# Patient Record
Sex: Female | Born: 1985 | Marital: Married | Smoking: Never smoker
Health system: Southern US, Community
[De-identification: ages and names within clinical notes are randomized; demographics above are authoritative.]

## PROBLEM LIST (undated history)

## (undated) DIAGNOSIS — N96 Recurrent pregnancy loss: Secondary | ICD-10-CM

## (undated) DIAGNOSIS — E669 Obesity, unspecified: Secondary | ICD-10-CM

## (undated) HISTORY — PX: INTRAUTERINE DEVICE (IUD) INSERTION: SHX5877

## (undated) HISTORY — PX: APPENDECTOMY: SHX54

## (undated) HISTORY — DX: Obesity, unspecified: E66.9

## (undated) HISTORY — DX: Recurrent pregnancy loss: N96

---

## 2019-01-26 ENCOUNTER — Telehealth: Payer: Self-pay | Admitting: Family

## 2019-01-26 DIAGNOSIS — R05 Cough: Secondary | ICD-10-CM

## 2019-01-26 DIAGNOSIS — R059 Cough, unspecified: Secondary | ICD-10-CM

## 2019-01-26 MED ORDER — BENZONATATE 200 MG PO CAPS: 200.0000 mg | ORAL_CAPSULE | Freq: Three times a day (TID) | ORAL | 0 refills | Status: DC | PRN

## 2019-01-26 MED ORDER — ALBUTEROL SULFATE 108 (90 BASE) MCG/ACT IN AEPB: 2.0000 | INHALATION_SPRAY | RESPIRATORY_TRACT | 2 refills | Status: DC | PRN

## 2019-01-26 NOTE — Progress Notes (Signed)
Greater than 5 minutes, yet less than 10 minutes of time have been spent researching, coordinating, and implementing care for this patient today.  Thank you for the details you included in the comment boxes. Those details are very helpful in determining the best course of treatment for you and help us to provide the best care.  E-Visit for Corona Virus Screening  Based on your current symptoms, it seems unlikely that your symptoms are related to the Coronavirus.   Coronavirus disease 2019 (COVID-19) is a respiratory illness that can spread from person to person. The virus that causes COVID-19 is a new virus that was first identified in the country of [place] but is now found in multiple other countries and has spread to the [place].  Symptoms associated with the virus are mild to severe fever, cough, and shortness of breath. There is currently no vaccine to protect against COVID-19, and there is no specific antiviral treatment for the virus.   To be considered HIGH RISK for Coronavirus (COVID-19), you have to meet the following criteria:  . Traveled to [place], [place], [place], [place] or [place]; or in the [place] to [place], [place], [place], or [place]; and have fever, cough, and shortness of breath within the last 2 weeks of travel OR  . Been in close contact with a person diagnosed with COVID-19 within the last 2 weeks and have fever, cough, and shortness of breath  . IF YOU DO NOT MEET THESE CRITERIA, YOU ARE CONSIDERED LOW RISK FOR COVID-19.   It is vitally important that if you feel that you have an infection such as this virus or any other virus that you stay home and away from places where you may spread it to others.  You should self-quarantine for 14 days if you have symptoms that could potentially be coronavirus and avoid contact with people age 65 and older.   You can use medication such as A prescription cough medication called Tessalon Perles 100 mg. You may take 1-2  capsules every 8 hours as needed for cough and A prescription inhaler called Albuterol MDI 90 mcg /actuation 2 puffs every 4 hours as needed for shortness of breath, wheezing, cough  You may also take acetaminophen (Tylenol) as needed for fever.   Reduce your risk of any infection by using the same precautions used for avoiding the common cold or flu:  . Wash your hands often with soap and warm water for at least 20 seconds.  If soap and water are not readily available, use an alcohol-based hand sanitizer with at least 60% alcohol.  . If coughing or sneezing, cover your mouth and nose by coughing or sneezing into the elbow areas of your shirt or coat, into a tissue or into your sleeve (not your hands). . Avoid shaking hands with others and consider head nods or verbal greetings only. . Avoid touching your eyes, nose, or mouth with unwashed hands.  . Avoid close contact with people who are sick. . Avoid places or events with large numbers of people in one location, like concerts or sporting events. . Carefully consider travel plans you have or are making. . If you are planning any travel outside or inside the [place], visit the CDC's Travelers' Health webpage for the latest health notices. . If you have some symptoms but not all symptoms, continue to monitor at home and seek medical attention if your symptoms worsen. . If you are having a medical emergency, call 911.    HOME CARE . Only take medications as instructed by your medical team. . Drink plenty of fluids and get plenty of rest. . A steam or ultrasonic humidifier can help if you have congestion.   GET HELP RIGHT AWAY IF: . You develop worsening fever. . You become short of breath . You cough up blood. . Your symptoms become more severe MAKE SURE YOU   Understand these instructions.  Will watch your condition.  Will get help right away if you are not doing well or get worse.  Your e-visit answers were reviewed by a board certified  advanced clinical practitioner to complete your personal care plan.  Depending on the condition, your plan could have included both over the counter or prescription medications.  If there is a problem please reply once you have received a response from your provider. Your safety is important to us.  If you have drug allergies check your prescription carefully.    You can use MyChart to ask questions about today's visit, request a non-urgent call back, or ask for a work or school excuse for 24 hours related to this e-Visit. If it has been greater than 24 hours you will need to follow up with your provider, or enter a new e-Visit to address those concerns. You will get an e-mail in the next two days asking about your experience.  I hope that your e-visit has been valuable and will speed your recovery. Thank you for using e-visits.    

## 2019-03-08 ENCOUNTER — Ambulatory Visit (INDEPENDENT_AMBULATORY_CARE_PROVIDER_SITE_OTHER): Payer: No Typology Code available for payment source | Admitting: Physician Assistant

## 2019-03-08 ENCOUNTER — Other Ambulatory Visit: Payer: Self-pay

## 2019-03-08 ENCOUNTER — Encounter: Payer: Self-pay | Admitting: Physician Assistant

## 2019-03-08 VITALS — BP 132/87 | HR 95 | Temp 98.8°F | Ht 66.0 in | Wt 188.0 lb

## 2019-03-08 DIAGNOSIS — Z0184 Encounter for antibody response examination: Secondary | ICD-10-CM

## 2019-03-08 DIAGNOSIS — R030 Elevated blood-pressure reading, without diagnosis of hypertension: Secondary | ICD-10-CM

## 2019-03-08 DIAGNOSIS — E6609 Other obesity due to excess calories: Secondary | ICD-10-CM

## 2019-03-08 DIAGNOSIS — Z7689 Persons encountering health services in other specified circumstances: Secondary | ICD-10-CM

## 2019-03-08 DIAGNOSIS — Z6830 Body mass index (BMI) 30.0-30.9, adult: Secondary | ICD-10-CM

## 2019-03-08 DIAGNOSIS — Z0289 Encounter for other administrative examinations: Secondary | ICD-10-CM | POA: Diagnosis not present

## 2019-03-08 DIAGNOSIS — Z111 Encounter for screening for respiratory tuberculosis: Secondary | ICD-10-CM

## 2019-03-08 DIAGNOSIS — Z130 Encounter for screening for diseases of the blood and blood-forming organs and certain disorders involving the immune mechanism: Secondary | ICD-10-CM

## 2019-03-08 DIAGNOSIS — Z1329 Encounter for screening for other suspected endocrine disorder: Secondary | ICD-10-CM

## 2019-03-08 DIAGNOSIS — N96 Recurrent pregnancy loss: Secondary | ICD-10-CM | POA: Insufficient documentation

## 2019-03-08 DIAGNOSIS — Z975 Presence of (intrauterine) contraceptive device: Secondary | ICD-10-CM

## 2019-03-08 NOTE — Patient Instructions (Signed)
Preventive Care 18-39 Years, Female Preventive care refers to lifestyle choices and visits with your health care provider that can promote health and wellness. What does preventive care include?   A yearly physical exam. This is also called an annual well check.  Dental exams once or twice a year.  Routine eye exams. Ask your health care provider how often you should have your eyes checked.  Personal lifestyle choices, including: ? Daily care of your teeth and gums. ? Regular physical activity. ? Eating a healthy diet. ? Avoiding tobacco and drug use. ? Limiting alcohol use. ? Practicing safe sex. ? Taking vitamin and mineral supplements as recommended by your health care provider. What happens during an annual well check? The services and screenings done by your health care provider during your annual well check will depend on your age, overall health, lifestyle risk factors, and family history of disease. Counseling Your health care provider may ask you questions about your:  Alcohol use.  Tobacco use.  Drug use.  Emotional well-being.  Home and relationship well-being.  Sexual activity.  Eating habits.  Work and work environment.  Method of birth control.  Menstrual cycle.  Pregnancy history. Screening You may have the following tests or measurements:  Height, weight, and BMI.  Diabetes screening. This is done by checking your blood sugar (glucose) after you have not eaten for a while (fasting).  Blood pressure.  Lipid and cholesterol levels. These may be checked every 5 years starting at age 20.  Skin check.  Hepatitis C blood test.  Hepatitis B blood test.  Sexually transmitted disease (STD) testing.  BRCA-related cancer screening. This may be done if you have a family history of breast, ovarian, tubal, or peritoneal cancers.  Pelvic exam and Pap test. This may be done every 3 years starting at age 21. Starting at age 30, this may be done every 5  years if you have a Pap test in combination with an HPV test. Discuss your test results, treatment options, and if necessary, the need for more tests with your health care provider. Vaccines Your health care provider may recommend certain vaccines, such as:  Influenza vaccine. This is recommended every year.  Tetanus, diphtheria, and acellular pertussis (Tdap, Td) vaccine. You may need a Td booster every 10 years.  Varicella vaccine. You may need this if you have not been vaccinated.  HPV vaccine. If you are 26 or younger, you may need three doses over 6 months.  Measles, mumps, and rubella (MMR) vaccine. You may need at least one dose of MMR. You may also need a second dose.  Pneumococcal 13-valent conjugate (PCV13) vaccine. You may need this if you have certain conditions and were not previously vaccinated.  Pneumococcal polysaccharide (PPSV23) vaccine. You may need one or two doses if you smoke cigarettes or if you have certain conditions.  Meningococcal vaccine. One dose is recommended if you are age 19-21 years and a first-year college student living in a residence hall, or if you have one of several medical conditions. You may also need additional booster doses.  Hepatitis A vaccine. You may need this if you have certain conditions or if you travel or work in places where you may be exposed to hepatitis A.  Hepatitis B vaccine. You may need this if you have certain conditions or if you travel or work in places where you may be exposed to hepatitis B.  Haemophilus influenzae type b (Hib) vaccine. You may need this if you   have certain risk factors. Talk to your health care provider about which screenings and vaccines you need and how often you need them. This information is not intended to replace advice given to you by your health care provider. Make sure you discuss any questions you have with your health care provider. Document Released: 12/21/2001 Document Revised: 06/07/2017  Document Reviewed: 08/26/2015 Elsevier Interactive Patient Education  2019 Elsevier Inc.   Preventing Unhealthy [employer], Adult Staying at a healthy weight is important to your overall health. When fat builds up in your body, you may become overweight or obese. Being overweight or obese increases your risk of developing certain health problems, such as heart disease, diabetes, sleeping problems, joint problems, and some types of cancer. Unhealthy weight gain is often the result of making unhealthy food choices or not getting enough exercise. You can make changes to your lifestyle to prevent obesity and stay as healthy as possible. What nutrition changes can be made?   Eat only as much as your body needs. To do this: ? Pay attention to signs that you are hungry or full. Stop eating as soon as you feel full. ? If you feel hungry, try drinking water first before eating. Drink enough water so your urine is clear or pale yellow. ? Eat smaller portions. Pay attention to portion sizes when eating out. ? Look at serving sizes on food labels. Most foods contain more than one serving per container. ? Eat the recommended number of calories for your gender and activity level. For most active people, a daily total of 2,000 calories is appropriate. If you are trying to lose weight or are not very active, you may need to eat fewer calories. Talk with your health care provider or a diet and nutrition specialist (dietitian) about how many calories you need each day.  Choose healthy foods, such as: ? Fruits and vegetables. At each meal, try to fill at least half of your plate with fruits and vegetables. ? Whole grains, such as whole-wheat bread, brown rice, and quinoa. ? Lean meats, such as chicken or fish. ? Other healthy proteins, such as beans, eggs, or tofu. ? Healthy fats, such as nuts, seeds, fatty fish, and olive oil. ? Low-fat or fat-free dairy products.  Check food labels, and avoid food and  drinks that: ? Are high in calories. ? Have added sugar. ? Are high in sodium. ? Have saturated fats or trans fats.  Cook foods in healthier ways, such as by baking, broiling, or grilling.  Make a meal plan for the week, and shop with a grocery list to help you stay on track with your purchases. Try to avoid going to the grocery store when you are hungry.  When grocery shopping, try to shop around the outside of the store first, where the fresh foods are. Doing this helps you to avoid prepackaged foods, which can be high in sugar, salt (sodium), and fat. What lifestyle changes can be made?   Exercise for 30 or more minutes on 5 or more days each week. Exercising may include brisk walking, yard work, biking, running, swimming, and team sports like basketball and soccer. Ask your health care provider which exercises are safe for you.  Do muscle-strengthening activities, such as lifting weights or using resistance bands, on 2 or more days a week.  Do not use any products that contain nicotine or tobacco, such as cigarettes and e-cigarettes. If you need help quitting, ask your health care provider.  Limit  alcohol intake to no more than 1 drink a day for nonpregnant women and 2 drinks a day for men. One drink equals 12 oz of beer, 5 oz of wine, or 1 oz of hard liquor.  Try to get 7-9 hours of sleep each night. What other changes can be made?  Keep a food and activity journal to keep track of: ? What you ate and how many calories you had. Remember to count the calories in sauces, dressings, and side dishes. ? Whether you were active, and what exercises you did. ? Your calorie, weight, and activity goals.  Check your weight regularly. Track any changes. If you notice you have gained weight, make changes to your diet or activity routine.  Avoid taking weight-loss medicines or supplements. Talk to your health care provider before starting any new medicine or supplement.  Talk to your health  care provider before trying any new diet or exercise plan. Why are these changes important? Eating healthy, staying active, and having healthy habits can help you to prevent obesity. Those changes also:  Help you manage stress and emotions.  Help you connect with friends and family.  Improve your self-esteem.  Improve your sleep.  Prevent long-term health problems. What can happen if changes are not made? Being obese or overweight can cause you to develop joint or bone problems, which can make it hard for you to stay active or do activities you enjoy. Being obese or overweight also puts stress on your heart and lungs and can lead to health problems like diabetes, heart disease, and some cancers. Where to find more information Talk with your health care provider or a dietitian about healthy eating and healthy lifestyle choices. You may also find information from:  [place]. Department of Agriculture, MyPlate: FormerBoss.no  American Heart Association: www.heart.org  Centers for Disease Control and Prevention: http://www.wolf.info/ Summary  Staying at a healthy weight is important to your overall health. It helps you to prevent certain diseases and health problems, such as heart disease, diabetes, joint problems, sleep disorders, and some types of cancer.  Being obese or overweight can cause you to develop joint or bone problems, which can make it hard for you to stay active or do activities you enjoy.  You can prevent unhealthy weight gain by eating a healthy diet, exercising regularly, not smoking, limiting alcohol, and getting enough sleep.  Talk with your health care provider or a dietitian for guidance about healthy eating and healthy lifestyle choices. This information is not intended to replace advice given to you by your health care provider. Make sure you discuss any questions you have with your health care provider. Document Released: 10/26/2016 Document Revised: 08/05/2017 Document  Reviewed: 12/01/2016 Elsevier Interactive Patient Education  2019 [employer].

## 2019-03-08 NOTE — Progress Notes (Signed)
HPI:                                                                Julie Vaughn is a 33 y.o. 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 Denies any chronic health conditions or acute health concerns.  Depression screen PHQ 2/9 03/08/2019  Decreased Interest 0  Down, Depressed, Hopeless 0  PHQ - 2 Score 0      Past Medical History:  Diagnosis Date  . History of multiple miscarriages   . Obesity    Past Surgical History:  Procedure Laterality Date  . APPENDECTOMY    . INTRAUTERINE DEVICE (IUD) INSERTION     Social History   Tobacco Use  . Smoking status: Not on file  Substance Use Topics  . Alcohol use: Not on file   family history includes Heart attack in her maternal aunt and maternal aunt; Hypertension in her mother.    Review of Systems  Constitutional: Positive for unexpected weight change (weight gain).  Skin:       Lump/abscess R medial breast     Medications: No current outpatient medications on file.   No current facility-administered medications for this visit.    No Known Allergies     Objective:  BP 132/87   Pulse 95   Temp 98.8 F (37.1 C) (Oral)   Ht 5\' 6"  (1.676 m)   Wt 188 lb (85.3 kg)   BMI 30.34 kg/m  Vitals:   03/08/19 1505 03/08/19 1534  BP: (!) 147/78 132/87  Pulse: (!) 108 95  Temp: 98.8 F (37.1 C)   General Appearance:  Alert, cooperative, no distress, appropriate for age, obese female                            Head:  Normocephalic, without obvious abnormality, wearing a face mask                             Eyes:  PERRL, EOM's intact, conjunctiva and cornea clear                             Ears:  TM pearly gray color and semitransparent, external ear canals normal, both ears                            Nose:   Nares symmetrical, mucosa pink                             Neck:  Supple; symmetrical, trachea midline, no adenopathy; thyroid: no enlargement, symmetric, no tenderness/mass/nodules                             Back:  Symmetrical, no curvature, ROM normal               Chest/Breast:  deferred                           Lungs:  Clear to auscultation bilaterally, respirations unlabored                             Heart:  normal rate & regular rhythm, S1 and S2 normal, no murmurs, rubs, or gallops                     Abdomen:  Soft, non-tender, no mass or organomegaly              Genitourinary:  deferred         Musculoskeletal:  Tone and strength strong and symmetrical, all extremities; no joint pain or edema, normal gait and station                               Lymphatic:  No adenopathy             Skin/Hair/Nails:  Skin warm, dry and intact, no rashes or abnormal dyspigmentation on limited exam                   Neurologic:  Alert and oriented x3, no cranial nerve deficits, sensation grossly intact, normal gait and station, no tremor Psych: well-groomed, cooperative, good eye contact, euthymic mood, affect mood-congruent, speech is articulate, and thought processes clear and goal-directed   Assessment and Plan: 33 y.o. female with   .Julie Vaughn was seen today for establish care.  Diagnoses and all orders for this visit:  Encounter for physical examination related to employment -     Measles/Mumps/Rubella Immunity -     Varicella zoster antibody, IgG -     Hepatitis B Surface AntiBODY -     QuantiFERON-TB Gold Plus -     CBC -     COMPLETE METABOLIC PANEL WITH GFR -     Lipid Panel w/reflex Direct LDL -     TSH + free T4  Encounter to establish care  Elevated blood pressure reading -     COMPLETE METABOLIC PANEL WITH GFR -     TSH + free T4  Class 1 obesity due to excess calories with body mass index (BMI) of 30.0 to 30.9 in adult, unspecified whether serious comorbidity present -      TSH + free T4  Immunity status testing -     Measles/Mumps/Rubella Immunity -     Varicella zoster antibody, IgG -     Hepatitis B Surface AntiBODY  Screening-pulmonary TB -     QuantiFERON-TB Gold Plus  Screening for blood disease -     CBC -     COMPLETE METABOLIC PANEL WITH GFR  Screening for thyroid disorder -     TSH + free T4  History of multiple miscarriages  IUD (intrauterine device) in place Comments: Paraguard placed 01/26/2015   - Personally reviewed PMH, PSH, PFH, medications, allergies, HM - Age-appropriate cancer screening: overdue for Pap smear, she will schedule - Tdap UTD - PHQ2 negative - Elevated BP reading - active surveillance - BMI 30 - counseled on weight loss through decreased caloric intake and increased aerobic exercise - Routine fasting labs pending   Patient education and anticipatory guidance given Patient agrees with treatment plan Follow-up for Pap smear or sooner as needed if  symptoms worsen or fail to improve  Julie Russian PA-C

## 2019-03-09 LAB — MEASLES/MUMPS/RUBELLA IMMUNITY
Mumps IgG: 69.2 AU/mL
Rubella: 3.52 index
Rubeola IgG: >300 AU/mL

## 2019-03-09 LAB — HEPATITIS B SURFACE ANTIBODY,QUALITATIVE: Hep B S Ab: REACTIVE — AB

## 2019-03-09 LAB — VARICELLA ZOSTER ANTIBODY, IGG: Varicella IgG: 835.9 index

## 2019-03-10 LAB — CBC
HCT: 40.8 % (ref 35.0–45.0)
Hemoglobin: 13.8 g/dL (ref 11.7–15.5)
MCH: 27.2 pg (ref 27.0–33.0)
MCHC: 33.8 g/dL (ref 32.0–36.0)
MCV: 80.5 fL (ref 80.0–100.0)
MPV: 10.8 fL (ref 7.5–12.5)
Platelets: 352 10*3/uL (ref 140–400)
RBC: 5.07 10*6/uL (ref 3.80–5.10)
RDW: 12.7 % (ref 11.0–15.0)
WBC: 9.3 10*3/uL (ref 3.8–10.8)

## 2019-03-10 LAB — COMPLETE METABOLIC PANEL WITH GFR
AG Ratio: 1.6 (calc) (ref 1.0–2.5)
ALT: 18 U/L (ref 6–29)
AST: 17 U/L (ref 10–30)
Albumin: 4.4 g/dL (ref 3.6–5.1)
Alkaline phosphatase (APISO): 56 U/L (ref 31–125)
BUN: 11 mg/dL (ref 7–25)
CO2: 25 mmol/L (ref 20–32)
Calcium: 9.2 mg/dL (ref 8.6–10.2)
Chloride: 106 mmol/L (ref 98–110)
Creat: 0.73 mg/dL (ref 0.50–1.10)
GFR, Est African American: 124 mL/min/{1.73_m2} (ref >=60)
GFR, Est Non African American: 107 mL/min/{1.73_m2} (ref >=60)
Globulin: 2.7 g/dL (calc) (ref 1.9–3.7)
Glucose, Bld: 89 mg/dL (ref 65–99)
Potassium: 4 mmol/L (ref 3.5–5.3)
Sodium: 139 mmol/L (ref 135–146)
Total Bilirubin: 0.5 mg/dL (ref 0.2–1.2)
Total Protein: 7.1 g/dL (ref 6.1–8.1)

## 2019-03-10 LAB — QUANTIFERON-TB GOLD PLUS
Mitogen-NIL: >10 IU/mL
NIL: 0.04 IU/mL
QuantiFERON-TB Gold Plus: NEGATIVE
TB1-NIL: 0.01 IU/mL
TB2-NIL: 0 IU/mL

## 2019-03-10 LAB — LIPID PANEL W/REFLEX DIRECT LDL
Cholesterol: 176 mg/dL (ref <200)
HDL: 56 mg/dL (ref >=50)
LDL Cholesterol (Calc): 101 mg/dL (calc) — ABNORMAL HIGH
Non-HDL Cholesterol (Calc): 120 mg/dL (calc) (ref <130)
Total CHOL/HDL Ratio: 3.1 (calc) (ref <5.0)
Triglycerides: 91 mg/dL (ref <150)

## 2019-03-10 LAB — TSH+FREE T4: TSH W/REFLEX TO FT4: 0.8 mIU/L

## 2019-03-15 ENCOUNTER — Encounter: Payer: Self-pay | Admitting: Physician Assistant

## 2019-03-15 DIAGNOSIS — Z6830 Body mass index (BMI) 30.0-30.9, adult: Secondary | ICD-10-CM

## 2019-03-15 DIAGNOSIS — R030 Elevated blood-pressure reading, without diagnosis of hypertension: Secondary | ICD-10-CM | POA: Insufficient documentation

## 2019-03-15 DIAGNOSIS — Z975 Presence of (intrauterine) contraceptive device: Secondary | ICD-10-CM | POA: Insufficient documentation

## 2019-03-15 DIAGNOSIS — E6609 Other obesity due to excess calories: Secondary | ICD-10-CM | POA: Insufficient documentation

## 2019-03-22 ENCOUNTER — Other Ambulatory Visit (HOSPITAL_COMMUNITY)
Admission: RE | Admit: 2019-03-22 | Discharge: 2019-03-22 | Disposition: A | Discharge status: Home / Self Care | Payer: No Typology Code available for payment source | Source: Ambulatory Visit | Attending: Physician Assistant | Admitting: Physician Assistant

## 2019-03-22 ENCOUNTER — Ambulatory Visit (INDEPENDENT_AMBULATORY_CARE_PROVIDER_SITE_OTHER): Payer: No Typology Code available for payment source | Admitting: Physician Assistant

## 2019-03-22 ENCOUNTER — Encounter: Payer: Self-pay | Admitting: Physician Assistant

## 2019-03-22 VITALS — BP 111/73 | HR 68 | Wt 188.0 lb

## 2019-03-22 DIAGNOSIS — Z124 Encounter for screening for malignant neoplasm of cervix: Secondary | ICD-10-CM | POA: Insufficient documentation

## 2019-03-22 NOTE — Patient Instructions (Signed)
Preventing Cervical Cancer  Cervical cancer is cancer that grows on the cervix. The cervix is at the bottom of the uterus. It connects the uterus to the vagina. The uterus is where a baby develops during pregnancy. Cancer occurs when cells become abnormal and start to grow out of control. Cervical cancer grows slowly and may not cause any symptoms at first. Over time, the cancer can grow deep into the cervix tissue and spread to other areas. If it is found early, cervical cancer can be treated effectively. You can also take steps to prevent this type of cancer. Most cases of cervical cancer are caused by an STI (sexually transmitted infection) called human papillomavirus (HPV). One way to reduce your risk of cervical cancer is to avoid infection with the HPV virus. You can do this by practicing safe sex and by getting the HPV vaccine. Getting regular Pap tests is also important because this can help identify changes in cells that could lead to cancer. Your chances of getting this disease can also be reduced by making certain lifestyle changes. How can I protect myself from cervical cancer? Preventing HPV infection  Ask your health care provider about getting the HPV vaccine. If you are 26 years old or younger, you may need to get this vaccine, which is given in three doses over 6 months. This vaccine protects against the types of HPV that could cause cancer.  Limit the number of people you have sex with. Also avoid having sex with people who have had many sex partners.  Use a latex condom during sex. Getting Pap tests  Get Pap tests regularly, starting at age 21. Talk with your health care provider about how often you need these tests. ? Most women who are 21?35 years of age should have a Pap test every 3 years. ? Most women who are 30?35 years of age should have a Pap test in combination with an HPV test every 5 years. ? Women with a higher risk of cervical cancer, such as those with a weakened  immune system or those who have been exposed to the drug diethylstilbestrol (DES), may need more frequent testing. Making other lifestyle changes  Do not use any products that contain nicotine or tobacco, such as cigarettes and e-cigarettes. If you need help quitting, ask your health care provider.  Eat at least 5 servings of fruits and vegetables every day.  Lose weight if you are overweight. Why are these changes important?  These changes and screening tests are designed to address the factors that are known to increase the risk of cervical cancer. Taking these steps is the best way to reduce your risk.  Having regular Pap tests will help identify changes in cells that could lead to cancer. Steps can then be taken to prevent cancer from developing.  These changes will also help find cervical cancer early. This type of cancer can be treated effectively if it is found early. It can be more dangerous and difficult to treat if cancer has grown deep into your cervix or has spread.  In addition to making you less likely to get cervical cancer, these changes will also provide other health benefits, such as the following: ? Practicing safe sex is important for preventing STIs and unplanned pregnancies. ? Avoiding tobacco can reduce your risk for other cancers and health issues. ? Eating a healthy diet and maintaining a healthy weight are good for your overall health. What can happen if changes are not made? In   the early stages, cervical cancer might not have any symptoms. It can take many years for the cancer to grow and get deep into the cervix tissue. This may be happening without you knowing about it. If you develop any symptoms, such as pelvic pain or unusual discharge or bleeding from your vagina, you should see your health care provider right away. If cervical cancer is not found early, you might need treatments such as radiation, chemotherapy, or surgery. In some cases, surgery may mean that  you will not be able to get pregnant or carry a pregnancy to term. Where to find support Talk with your health care provider, school nurse, or local health department for guidance about screening and vaccination. Some children and teens may be able to get the HPV vaccine free of charge through the U.S. government's Vaccines for Children (VFC) program. Other places that provide vaccinations include:  Public health clinics. Check with your local health department.  Federally Qualified Health Centers, where you would pay only what you can afford. To find one near you, check this website: www.fqhc.org/find-an-fqhc/  Rural Health Clinics. These are part of a program for Medicare and Medicaid patients who live in rural areas. The National Breast and Cervical Cancer Early Detection Program also provides breast and cervical cancer screenings and diagnostic services to low-income, uninsured, and underinsured women. Cervical cancer can be passed down through families. Talk with your health care provider or genetic counselor to learn more about genetic testing for cancer. Where to find more information Learn more about cervical cancer from:  American College of Gynecology: www.acog.org/Patients/FAQs/Cervical-Cancer  American Cancer Society: www.cancer.org/cancer/cervicalcancer/  U.S. Centers for Disease Control and Prevention: www.cdc.gov/cancer/cervical/ Summary  Talk with your health care provider about getting the HPV vaccine.  Be sure to get regular Pap tests as recommended by your health care provider.  See your health care provider right away if you have any pelvic pain or unusual discharge or bleeding from your vagina. This information is not intended to replace advice given to you by your health care provider. Make sure you discuss any questions you have with your health care provider. Document Released: 11/09/2015 Document Revised: 06/22/2016 Document Reviewed: 06/22/2016 Elsevier  Interactive Patient Education  2019 Elsevier Inc.  

## 2019-03-22 NOTE — Progress Notes (Signed)
HPI:                                                                Julie Vaughn is a 33 y.o. female who presents to [facility]: Primary Care Sports Medicine today for Pap smear only   GYN/Sexual Health  Obstetrics: [id number]  Menstrual status: having periods  LMP: 03/10/19  Last pap smear: unknown  History of abnormal pap smears: no  Sexually active: yes, 1 female partner  Current contraception: Paragard  History of STI: no, declines screening   Health Maintenance Health Maintenance  Topic Date Due  . HIV Screening  02/11/1999  . PAP SMEAR-Modifier  02/10/2005  . INFLUENZA VACCINE  06/09/2019  . TETANUS/TDAP  02/29/2028    Past Medical History:  Diagnosis Date  . History of multiple miscarriages   . Obesity    Past Surgical History:  Procedure Laterality Date  . APPENDECTOMY    . INTRAUTERINE DEVICE (IUD) INSERTION     Social History   Tobacco Use  . Smoking status: Never Smoker  . Smokeless tobacco: Never Used  Substance Use Topics  . Alcohol use: Yes   family history includes Heart attack in her maternal aunt and maternal aunt; Hypertension in her mother.  ROS: negative except as noted in the HPI  Medications: Current Outpatient Medications  Medication Sig Dispense Refill  . Copper (PARAGARD) IUD IUD 1 each by Intrauterine route once.     No current facility-administered medications for this visit.    No Known Allergies     Objective:  BP 111/73   Pulse 68   Wt 188 lb (85.3 kg)   LMP 03/10/2019 (Exact Date)   BMI 30.34 kg/m  GU: vulva without rashes or lesions, normal introitus and urethral meatus, vaginal mucosa without erythema, normal discharge, cervix non-friable without lesions, adnexa without masses or tenderness  A chaperone was present for the GU portion of the exam, Julie Vaughn, RMA.    No results found for this or any previous visit (from the past 72 hour(s)). No results found.    Assessment  and Plan: 33 y.o. female with   .Julie Vaughn was seen today for gynecologic exam.  Diagnoses and all orders for this visit:  Encounter for Pap smear of cervix with HPV DNA cotesting -     Cytology - PAP     Patient education and anticipatory guidance given Patient agrees with treatment plan Follow-up based on Pap results or sooner as needed  Levonne Hubert PA-C

## 2019-03-23 LAB — CYTOLOGY - PAP
Diagnosis: NEGATIVE
HPV: NOT DETECTED

## 2020-02-12 ENCOUNTER — Ambulatory Visit (INDEPENDENT_AMBULATORY_CARE_PROVIDER_SITE_OTHER): Payer: No Typology Code available for payment source | Admitting: Medical-Surgical

## 2020-02-12 ENCOUNTER — Other Ambulatory Visit: Payer: Self-pay

## 2020-02-12 ENCOUNTER — Encounter: Payer: Self-pay | Admitting: Medical-Surgical

## 2020-02-12 VITALS — BP 134/86 | HR 88 | Temp 98.5°F | Ht 66.0 in | Wt 190.0 lb

## 2020-02-12 DIAGNOSIS — M2740 Unspecified cyst of jaw: Secondary | ICD-10-CM | POA: Diagnosis not present

## 2020-02-12 MED ORDER — CEPHALEXIN 500 MG PO CAPS: 500.0000 mg | ORAL_CAPSULE | Freq: Two times a day (BID) | ORAL | 0 refills | Status: DC

## 2020-02-12 NOTE — Progress Notes (Signed)
Subjective:    CC: left jaw pain, establish care  HPI: Very pleasant 34 year old female presenting today to establish care and for evaluation of a tender lump on her left jaw that she noticed yesterday.  Went to the dentist 1 to 2 weeks ago, had x-rays and cleaning with no abnormalities found.  Noticed the lump on her left lower jaw yesterday, tender to palpation.  Pain made worse with pressure but has not tried anything to make it better.  Denies fever, chills.  I reviewed the past medical history, family history, social history, surgical history, and allergies today and no changes were needed.  Please see the problem list section below in epic for further details.  Past Medical History: Past Medical History:  Diagnosis Date  . History of multiple miscarriages   . Obesity    Past Surgical History: Past Surgical History:  Procedure Laterality Date  . APPENDECTOMY    . INTRAUTERINE DEVICE (IUD) INSERTION     Social History: Social History   Socioeconomic History  . Marital status: Unknown    Spouse name: Not on file  . Number of children: Not on file  . Years of education: Not on file  . Highest education level: Not on file  Occupational History  . Not on file  Tobacco Use  . Smoking status: Never Smoker  . Smokeless tobacco: Never Used  Substance and Sexual Activity  . Alcohol use: Yes  . Drug use: Never  . Sexual activity: Yes    Birth control/protection: I.U.D.  Other Topics Concern  . Not on file  Social History Narrative  . Not on file   Social Determinants of Health   Financial Resource Strain:   . Difficulty of Paying Living Expenses:   Food Insecurity:   . Worried About Programme researcher, broadcasting/film/video in the Last Year:   . Barista in the Last Year:   Transportation Needs:   . Freight forwarder (Medical):   Marland Kitchen Lack of Transportation (Non-Medical):   Physical Activity:   . Days of Exercise per Week:   . Minutes of Exercise per Session:   Stress:   .  Feeling of Stress :   Social Connections:   . Frequency of Communication with Friends and Family:   . Frequency of Social Gatherings with Friends and Family:   . Attends Religious Services:   . Active Member of Clubs or Organizations:   . Attends Banker Meetings:   Marland Kitchen Marital Status:    Family History: Family History  Problem Relation Age of Onset  . Hypertension Mother   . Heart attack Maternal Aunt   . Heart attack Maternal Aunt   . Cancer Neg Hx    Allergies: No Known Allergies Medications: See med rec.  Review of Systems: No fevers, chills, night sweats, weight loss, chest pain, or shortness of breath.   Objective:    General: Well Developed, well nourished, and in no acute distress.  Neuro: Alert and oriented x3.  HEENT: Normocephalic, atraumatic.  Mild swelling to the anterior left lower jaw without alteration in skin, erythema.  With palpation, small nodule appreciated.  Inspection and palpation of left inner cheek and gumline showed no abnormalities. Skin: Warm and dry. Cardiac: Regular rate and rhythm, no murmurs rubs or gallops, no lower extremity edema.  Respiratory: Clear to auscultation bilaterally. Not using accessory muscles, speaking in full sentences.   Impression and Recommendations:    Left lower jaw cyst From presentation and  assessment findings, suspect left lower jaw cyst.  Will treat with Keflex 500 mg twice daily x7 days.  Advised patient to do warm compresses 3-4 times daily to see if this helps.  If no improvement, may need imaging.  Return if symptoms worsen or fail to improve.  ___________________________________________ Clearnce Sorrel, DNP, APRN, FNP-BC Primary Care and [place]

## 2020-02-18 ENCOUNTER — Encounter: Payer: Self-pay | Admitting: Physician Assistant

## 2020-02-18 DIAGNOSIS — B379 Candidiasis, unspecified: Secondary | ICD-10-CM

## 2020-02-18 MED ORDER — FLUCONAZOLE 150 MG PO TABS: 150.0000 mg | ORAL_TABLET | Freq: Once | ORAL | 0 refills | Status: AC

## 2020-03-04 ENCOUNTER — Other Ambulatory Visit: Payer: Self-pay

## 2020-03-04 ENCOUNTER — Ambulatory Visit (INDEPENDENT_AMBULATORY_CARE_PROVIDER_SITE_OTHER): Payer: No Typology Code available for payment source

## 2020-03-04 ENCOUNTER — Encounter: Payer: Self-pay | Admitting: Physician Assistant

## 2020-03-04 ENCOUNTER — Encounter: Payer: Self-pay | Admitting: Medical-Surgical

## 2020-03-04 ENCOUNTER — Ambulatory Visit (INDEPENDENT_AMBULATORY_CARE_PROVIDER_SITE_OTHER): Payer: No Typology Code available for payment source | Admitting: Medical-Surgical

## 2020-03-04 VITALS — BP 129/90 | HR 91 | Temp 98.0°F | Ht 66.0 in | Wt 191.3 lb

## 2020-03-04 DIAGNOSIS — M2740 Unspecified cyst of jaw: Secondary | ICD-10-CM

## 2020-03-04 DIAGNOSIS — R220 Localized swelling, mass and lump, head: Secondary | ICD-10-CM

## 2020-03-04 MED ORDER — IOHEXOL 300 MG/ML  SOLN
100.0000 mL | Freq: Once | INTRAMUSCULAR | Status: AC | PRN
  Administered 2020-03-04: 80 mL via INTRAVENOUS

## 2020-03-04 NOTE — Progress Notes (Signed)
Subjective:    CC: follow up on jaw swelling  HPI: Very pleasant 34 year old female presenting for follow up on left jaw swelling. Took Keflex for 7 days as prescribed on 02/12/2020. Swelling is slightly smaller but still present. No longer tender to touch. No fever, chills, or other constitutional symptoms. No difficulty chewing.   Notes that she has intermittent sharp, stabbing ear pain. Reports that her dentist told her she grinds her teeth, suggested a mouth guard at night. Jaw pops on the left side frequently and she has noted some malalignment when watching a video of her presentation. She suspects TMJ is causing these symptoms and plans to seek treatment after she is done with school.   I reviewed the past medical history, family history, social history, surgical history, and allergies today and no changes were needed.  Please see the problem list section below in epic for further details.  Past Medical History: Past Medical History:  Diagnosis Date  . History of multiple miscarriages   . Obesity    Past Surgical History: Past Surgical History:  Procedure Laterality Date  . APPENDECTOMY    . INTRAUTERINE DEVICE (IUD) INSERTION     Social History: Social History   Socioeconomic History  . Marital status: Unknown    Spouse name: Not on file  . Number of children: Not on file  . Years of education: Not on file  . Highest education level: Not on file  Occupational History  . Not on file  Tobacco Use  . Smoking status: Never Smoker  . Smokeless tobacco: Never Used  Substance and Sexual Activity  . Alcohol use: Yes  . Drug use: Never  . Sexual activity: Yes    Birth control/protection: I.U.D.  Other Topics Concern  . Not on file  Social History Narrative  . Not on file   Social Determinants of Health   Financial Resource Strain:   . Difficulty of Paying Living Expenses:   Food Insecurity:   . Worried About Programme researcher, broadcasting/film/video in the Last Year:   . Engineer, site in the Last Year:   Transportation Needs:   . Freight forwarder (Medical):   Marland Kitchen Lack of Transportation (Non-Medical):   Physical Activity:   . Days of Exercise per Week:   . Minutes of Exercise per Session:   Stress:   . Feeling of Stress :   Social Connections:   . Frequency of Communication with Friends and Family:   . Frequency of Social Gatherings with Friends and Family:   . Attends Religious Services:   . Active Member of Clubs or Organizations:   . Attends Banker Meetings:   Marland Kitchen Marital Status:    Family History: Family History  Problem Relation Age of Onset  . Hypertension Mother   . Heart attack Maternal Aunt   . Heart attack Maternal Aunt   . Cancer Neg Hx    Allergies: No Known Allergies Medications: See med rec.  Review of Systems: No fevers, chills, night sweats, weight loss, chest pain, or shortness of breath.   Objective:    General: Well Developed, well nourished, and in no acute distress.  Neuro: Alert and oriented x3.  HEENT: Normocephalic, atraumatic.  Skin: Warm and dry. Cardiac: Regular rate and rhythm, no murmurs rubs or gallops, no lower extremity edema.  Respiratory: Clear to auscultation bilaterally. Not using accessory muscles, speaking in full sentences. Left jaw: No visible swelling but small area of firmness noted on  palpation of the left mandible. No tenderness on palpation.    Impression and Recommendations:    2. Localized swelling, mass, and lump of head Suspected cyst of jaw not responsive to antibiotics and conservative measures. Next step is imaging with CT maxillofacial w/contrast. Will schedule at patient convenience and develop further plan pending results.  - CT Maxillofacial W/Cm; Future  Return if symptoms worsen or fail to improve. ___________________________________________ Clearnce Sorrel, DNP, APRN, FNP-BC Primary Care and [place]

## 2020-03-05 ENCOUNTER — Other Ambulatory Visit: Payer: Self-pay

## 2020-03-05 DIAGNOSIS — Z111 Encounter for screening for respiratory tuberculosis: Secondary | ICD-10-CM

## 2020-03-08 LAB — QUANTIFERON-TB GOLD PLUS
Mitogen-NIL: >10 IU/mL
NIL: 0.06 IU/mL
QuantiFERON-TB Gold Plus: NEGATIVE
TB1-NIL: 0.04 IU/mL
TB2-NIL: 0.06 IU/mL

## 2021-11-27 ENCOUNTER — Ambulatory Visit: Payer: No Typology Code available for payment source | Admitting: Medical-Surgical

## 2021-11-27 ENCOUNTER — Ambulatory Visit: Payer: Self-pay | Admitting: Medical-Surgical

## 2022-04-02 ENCOUNTER — Ambulatory Visit (INDEPENDENT_AMBULATORY_CARE_PROVIDER_SITE_OTHER): Payer: No Typology Code available for payment source | Admitting: Sports Medicine

## 2022-04-02 ENCOUNTER — Encounter: Payer: Self-pay | Admitting: Sports Medicine

## 2022-04-02 DIAGNOSIS — R1010 Upper abdominal pain, unspecified: Secondary | ICD-10-CM | POA: Diagnosis not present

## 2022-04-02 NOTE — Assessment & Plan Note (Addendum)
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 She recalls an episode recently in the middle of the night of having acute upper abdominal pain, an urge to stool, she then had what sounds to be a vagal episode with nausea, sweating, dizziness, vomiting and diarrhea. This resolved very quickly. The pain was described as acute, sudden, left upper quadrant with radiation to the right upper quadrant. Not described as colicky, no association with food, drink, medications, activity. She denies any melena, hematochezia, fevers, chills, she does have clay colored stools. She also has lost a great deal of weight on purpose through diet and exercise. Differential is broad, including biliary colic, peptic ulcer disease, pancreatitis. Exam is completely unremarkable, no areas of tenderness, no palpable masses, negative Murphy sign. We will do a broad work-up, CBC with differential, CMP, amylase, lipase, urinalysis, abdominal ultrasound. If all unrevealing we will do an abdominal and pelvic CT with oral and IV contrast +/- HIDA scan. If this is negative we will have gastroenterology take a look with an upper and lower endoscopy. We did discuss a recent event with severe stress, 2 months prior to the episode, I told her this was unlikely to be relevant as I would have expected the abdominal symptoms to occur much sooner after the episode of stress. Return to see me after the work-up is complete.  Update: Common bile duct at the upper limit of normal thyroid ultrasound, still having some abdominal pain, proceeding with HIDA scan.  Update 2: HIDA scan normal.  Update 3: Patient agreeable to see gastroenterology.

## 2022-04-02 NOTE — Progress Notes (Addendum)
    Procedures performed today:    None.  Independent interpretation of notes and tests performed by another provider:   None.  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 This resolved very quickly. The pain was described as acute, sudden, left upper quadrant with radiation to the right upper quadrant. Not described as colicky, no association with food, drink, medications, activity. She denies any melena, hematochezia, fevers, chills, she does have clay colored stools. She also has lost a great deal of weight on purpose through diet and exercise. Differential is broad, including biliary colic, peptic ulcer disease, pancreatitis. Exam is completely unremarkable, no areas of tenderness, no palpable masses, negative Murphy sign. We will do a broad work-up, CBC with differential, CMP, amylase, lipase, urinalysis, abdominal ultrasound. If all unrevealing we will do an abdominal and pelvic CT with oral and IV contrast +/- HIDA scan. If this is negative we will have gastroenterology take a look with an upper and lower endoscopy. We did discuss a recent event with severe stress, 2 months prior to the episode, I told her this was unlikely to be relevant as I would have expected the abdominal symptoms to occur much sooner after the episode of stress. Return to see me after the work-up is complete.  Update: Common bile duct at the upper limit of normal thyroid ultrasound, still having some abdominal pain, proceeding with HIDA scan.  Update 2: HIDA scan normal.  Update 3: Patient agreeable to see  gastroenterology.    ___________________________________________ Ihor Austin. Benjamin Stain, M.D., ABFM., CAQSM. Primary Care and Sports Medicine [facility]  Adjunct Instructor of Family Medicine  [employer]

## 2022-04-03 LAB — CBC WITH DIFFERENTIAL/PLATELET
Absolute Monocytes: 689 cells/uL (ref 200–950)
Basophils Absolute: 42 cells/uL (ref 0–200)
Basophils Relative: 0.5 %
Eosinophils Absolute: 91 cells/uL (ref 15–500)
Eosinophils Relative: 1.1 %
HCT: 39.8 % (ref 35.0–45.0)
Hemoglobin: 13.3 g/dL (ref 11.7–15.5)
Lymphs Abs: 2258 cells/uL (ref 850–3900)
MCH: 28 pg (ref 27.0–33.0)
MCHC: 33.4 g/dL (ref 32.0–36.0)
MCV: 83.8 fL (ref 80.0–100.0)
MPV: 10.6 fL (ref 7.5–12.5)
Monocytes Relative: 8.3 %
Neutro Abs: 5221 cells/uL (ref 1500–7800)
Neutrophils Relative %: 62.9 %
Platelets: 308 10*3/uL (ref 140–400)
RBC: 4.75 10*6/uL (ref 3.80–5.10)
RDW: 12.4 % (ref 11.0–15.0)
Total Lymphocyte: 27.2 %
WBC: 8.3 10*3/uL (ref 3.8–10.8)

## 2022-04-03 LAB — COMPLETE METABOLIC PANEL WITH GFR
AG Ratio: 1.6 (calc) (ref 1.0–2.5)
ALT: 14 U/L (ref 6–29)
AST: 19 U/L (ref 10–30)
Albumin: 4.5 g/dL (ref 3.6–5.1)
Alkaline phosphatase (APISO): 53 U/L (ref 31–125)
BUN: 9 mg/dL (ref 7–25)
CO2: 28 mmol/L (ref 20–32)
Calcium: 9.5 mg/dL (ref 8.6–10.2)
Chloride: 103 mmol/L (ref 98–110)
Creat: 0.59 mg/dL (ref 0.50–0.97)
Globulin: 2.9 g/dL (calc) (ref 1.9–3.7)
Glucose, Bld: 99 mg/dL (ref 65–99)
Potassium: 3.9 mmol/L (ref 3.5–5.3)
Sodium: 138 mmol/L (ref 135–146)
Total Bilirubin: 0.4 mg/dL (ref 0.2–1.2)
Total Protein: 7.4 g/dL (ref 6.1–8.1)
eGFR: 118 mL/min/{1.73_m2} (ref >=60)

## 2022-04-03 LAB — URINALYSIS W MICROSCOPIC + REFLEX CULTURE
Bacteria, UA: NONE SEEN /HPF
Bilirubin Urine: NEGATIVE
Glucose, UA: NEGATIVE
Hyaline Cast: NONE SEEN /LPF
Ketones, ur: NEGATIVE
Leukocyte Esterase: NEGATIVE
Nitrites, Initial: NEGATIVE
Protein, ur: NEGATIVE
RBC / HPF: NONE SEEN /HPF (ref 0–2)
Specific Gravity, Urine: 1.01 (ref 1.001–1.035)
WBC, UA: NONE SEEN /HPF (ref 0–5)
pH: 6.5 (ref 5.0–8.0)

## 2022-04-03 LAB — NO CULTURE INDICATED

## 2022-04-03 LAB — AMYLASE: Amylase: 38 U/L (ref 21–101)

## 2022-04-03 LAB — LIPASE: Lipase: 37 U/L (ref 7–60)

## 2022-04-08 ENCOUNTER — Ambulatory Visit
Admission: RE | Admit: 2022-04-08 | Discharge: 2022-04-08 | Disposition: A | Discharge status: Home / Self Care | Payer: No Typology Code available for payment source | Source: Ambulatory Visit | Attending: Sports Medicine | Admitting: Sports Medicine

## 2022-04-08 DIAGNOSIS — R1010 Upper abdominal pain, unspecified: Secondary | ICD-10-CM | POA: Diagnosis not present

## 2022-04-12 NOTE — Addendum Note (Signed)
Addended by: Monica Becton on: 04/12/2022 04:48 PM   Modules accepted: Orders

## 2022-04-16 ENCOUNTER — Ambulatory Visit (HOSPITAL_COMMUNITY): Payer: No Typology Code available for payment source

## 2022-04-27 ENCOUNTER — Encounter
Admission: RE | Admit: 2022-04-27 | Discharge: 2022-04-27 | Disposition: A | Discharge status: Home / Self Care | Payer: No Typology Code available for payment source | Source: Ambulatory Visit | Attending: Sports Medicine | Admitting: Sports Medicine

## 2022-04-27 DIAGNOSIS — R1010 Upper abdominal pain, unspecified: Secondary | ICD-10-CM | POA: Insufficient documentation

## 2022-04-27 MED ORDER — TECHNETIUM TC 99M MEBROFENIN IV KIT
5.0700 | PACK | Freq: Once | INTRAVENOUS | Status: AC | PRN
  Administered 2022-04-27: 5.07 via INTRAVENOUS

## 2022-06-27 ENCOUNTER — Telehealth: Payer: Self-pay | Admitting: Emergency Medicine

## 2022-06-27 ENCOUNTER — Ambulatory Visit
Admission: RE | Admit: 2022-06-27 | Discharge: 2022-06-27 | Disposition: A | Discharge status: Home / Self Care | Payer: No Typology Code available for payment source | Source: Ambulatory Visit | Attending: Family Medicine | Admitting: Family Medicine

## 2022-06-27 VITALS — BP 127/83 | HR 64 | Temp 99.1°F | Resp 18 | Ht 66.0 in | Wt 165.0 lb

## 2022-06-27 DIAGNOSIS — N3000 Acute cystitis without hematuria: Secondary | ICD-10-CM

## 2022-06-27 LAB — POCT URINALYSIS DIP (MANUAL ENTRY)
Bilirubin, UA: NEGATIVE
Blood, UA: NEGATIVE
Glucose, UA: NEGATIVE mg/dL
Ketones, POC UA: NEGATIVE mg/dL
Nitrite, UA: NEGATIVE
Protein Ur, POC: NEGATIVE mg/dL
Spec Grav, UA: <=1.005 — AB (ref 1.010–1.025)
Urobilinogen, UA: 0.2 E.U./dL
pH, UA: 7 (ref 5.0–8.0)

## 2022-06-27 MED ORDER — FLUCONAZOLE 150 MG PO TABS: 150.0000 mg | ORAL_TABLET | Freq: Every day | ORAL | 0 refills | Status: DC

## 2022-06-27 MED ORDER — PHENAZOPYRIDINE HCL 200 MG PO TABS: 200.0000 mg | ORAL_TABLET | Freq: Three times a day (TID) | ORAL | 0 refills | Status: DC

## 2022-06-27 MED ORDER — CIPROFLOXACIN HCL 500 MG PO TABS: 500.0000 mg | ORAL_TABLET | Freq: Two times a day (BID) | ORAL | 0 refills | Status: DC

## 2022-06-27 NOTE — ED Triage Notes (Signed)
Patient c/o dysuria, urgency, frequency since yesterday.  No hematuria.  Patient has taken Ibuprofen.

## 2022-06-27 NOTE — Discharge Instructions (Signed)
Make sure that you are drinking lots of water I have prescribed Cipro to take 2 times a day for a week.  Take 2 doses today I have prescribed Pyridium to take for urinary pain.  Take as long as needed.  This does turn your urine bright orange I also prescribed Diflucan in case you develop yeast infection symptoms Call or return if not improving in a couple of days Your culture report will be available on MyChart

## 2022-06-27 NOTE — ED Provider Notes (Addendum)
[facility]    CSN: [id number] Arrival date & time: 06/27/22  1257      History   Chief Complaint Chief Complaint  Patient presents with   Urinary Frequency    UTI - Entered by patient   Appointment    HPI Julie Vaughn is a 36 y.o. female.   HPI  Patient is here for urinary symptoms.  Dysuria and frequency.  Urgency.  Is been going on since yesterday.  Briefly yesterday had some right back pain.  This has gone away.  Does not have any fever chills nausea or vomiting.  No history of kidney stones or kidney infections  Past Medical History:  Diagnosis Date   History of multiple miscarriages    Obesity     Patient Active Problem List   Diagnosis Date Noted   Acute upper abdominal pain 04/02/2022   Elevated blood pressure reading 03/15/2019   Class 1 obesity due to excess calories with body mass index (BMI) of 30.0 to 30.9 in adult 03/15/2019   IUD (intrauterine device) in place 03/15/2019   History of multiple miscarriages     Past Surgical History:  Procedure Laterality Date   APPENDECTOMY     INTRAUTERINE DEVICE (IUD) INSERTION      OB History     Gravida  5   Para  2   Term      Preterm      AB  3   Living  2      SAB  3   IAB      Ectopic      Multiple      Live Births               Home Medications    Prior to Admission medications   Medication Sig Start Date End Date Taking? Authorizing Provider  ciprofloxacin (CIPRO) 500 MG tablet Take 1 tablet (500 mg total) by mouth 2 (two) times daily. 06/27/22  Yes Eustace Moore, MD  [facility]) IUD IUD 1 each by Intrauterine route once.   Yes [provider]  fluconazole (DIFLUCAN) 150 MG tablet Take 1 tablet (150 mg total) by mouth daily. Repeat in 1 week if needed 06/27/22  Yes Eustace Moore, MD  Multiple Vitamins-Minerals (EMERGEN-C FIVE PO) Take 1 packet by mouth. Take a couple times a week.   Yes [provider]  phenazopyridine  (PYRIDIUM) 200 MG tablet Take 1 tablet (200 mg total) by mouth 3 (three) times daily. 06/27/22  Yes Eustace Moore, MD  Lysine 1000 MG TABS  03/31/22   [provider]    Family History Family History  Problem Relation Age of Onset   Hypertension Mother    Heart attack Maternal Aunt    Heart attack Maternal Aunt    Cancer Neg Hx     Social History Social History   Tobacco Use   Smoking status: Never   Smokeless tobacco: Never  Vaping Use   Vaping Use: Never used  Substance Use Topics   Alcohol use: Yes   Drug use: Never     Allergies   Patient has no known allergies.   Review of Systems Review of Systems See HPI  Physical Exam Triage Vital Signs ED Triage Vitals  Enc Vitals Group     BP 06/27/22 1320 127/83     Pulse Rate 06/27/22 1320 64     Resp 06/27/22 1320 18     Temp 06/27/22 1320 99.1  F (37.3 C)     Temp Source 06/27/22 1320 Oral     SpO2 06/27/22 1320 100 %     Weight 06/27/22 1321 165 lb (74.8 kg)     Height 06/27/22 1321 5\' 6"  (1.676 m)     Head Circumference --      Peak Flow --      Pain Score 06/27/22 1321 0     Pain Loc --      Pain Edu? --      Excl. in GC? --    No data found.  Updated Vital Signs BP 127/83 (BP Location: Right Arm)   Pulse 64   Temp 99.1 F (37.3 C) (Oral)   Resp 18   Ht 5\' 6"  (1.676 m)   Wt 74.8 kg   SpO2 100%   BMI 26.63 kg/m      Physical Exam Constitutional:      General: She is not in acute distress.    Appearance: She is well-developed.  HENT:     Head: Normocephalic and atraumatic.  Eyes:     Conjunctiva/sclera: Conjunctivae normal.     Pupils: Pupils are equal, round, and reactive to light.  Cardiovascular:     Rate and Rhythm: Normal rate.  Pulmonary:     Effort: Pulmonary effort is normal. No respiratory distress.  Abdominal:     General: There is no distension.     Palpations: Abdomen is soft.     Tenderness: There is no right CVA tenderness or left CVA tenderness.   Musculoskeletal:        General: Normal range of motion.     Cervical back: Normal range of motion.  Skin:    General: Skin is warm and dry.  Neurological:     Mental Status: She is alert.      UC Treatments / Results  Labs (all labs ordered are listed, but only abnormal results are displayed) Labs Reviewed  POCT URINALYSIS DIP (MANUAL ENTRY) - Abnormal; Notable for the following components:      Result Value   Spec Grav, UA <=1.005 (*)    Leukocytes, UA Small (1+) (*)    All other components within normal limits  URINE CULTURE    EKG   Radiology No results found.  Procedures Procedures (including critical care time)  Medications Ordered in UC Medications - No data to display  Initial Impression / Assessment and Plan / UC Course  I have reviewed the triage vital signs and the nursing notes.  Pertinent labs & imaging results that were available during my care of the patient were reviewed by me and considered in my medical decision making (see chart for details).     Urine culture is pending Treatment discussed.  Giving her 7 days of Cipro instead of the usual 5-day because of her history of back pain.  Although she does not have CVA tenderness or signs of pyelonephritis at this time. Final Clinical Impressions(s) / UC Diagnoses   Final diagnoses:  Acute cystitis without hematuria     Discharge Instructions      Make sure that you are drinking lots of water I have prescribed Cipro to take 2 times a day for a week.  Take 2 doses today I have prescribed Pyridium to take for urinary pain.  Take as long as needed.  This does turn your urine bright orange I also prescribed Diflucan in case you develop yeast infection symptoms Call or return if not improving in a  couple of days Your culture report will be available on MyChart    ED Prescriptions     Medication Sig Dispense Auth. Provider   ciprofloxacin (CIPRO) 500 MG tablet Take 1 tablet (500 mg total) by  mouth 2 (two) times daily. 14 tablet Eustace Moore, MD   phenazopyridine (PYRIDIUM) 200 MG tablet Take 1 tablet (200 mg total) by mouth 3 (three) times daily. 6 tablet Eustace Moore, MD   fluconazole (DIFLUCAN) 150 MG tablet Take 1 tablet (150 mg total) by mouth daily. Repeat in 1 week if needed 2 tablet Eustace Moore, MD      PDMP not reviewed this encounter.   Eustace Moore, MD 06/27/22 1343    Eustace Moore, MD 06/27/22 [phone]

## 2022-06-27 NOTE — Telephone Encounter (Signed)
Call from patient stating pharmacy had not received prescriptions. Call back to pharmacy after RN confirmed electronic receipt of all 2 prescriptions. No answer- pharmacist at lunch. Pt is still at pharmacy. RN left a voice mail with pharmacy for call back. Pt also stated she would ask pharmacy to call [facility] if they had questions.

## 2022-06-29 LAB — URINE CULTURE: Culture: 50000 — AB

## 2022-07-02 NOTE — Addendum Note (Signed)
Addended by: Monica Becton on: 07/02/2022 04:55 PM   Modules accepted: Orders

## 2022-08-26 ENCOUNTER — Encounter: Payer: Self-pay | Admitting: Gastroenterology

## 2022-10-06 ENCOUNTER — Ambulatory Visit
Admission: RE | Admit: 2022-10-06 | Discharge: 2022-10-06 | Disposition: A | Discharge status: Home / Self Care | Payer: No Typology Code available for payment source | Source: Ambulatory Visit | Attending: Family Medicine | Admitting: Family Medicine

## 2022-10-06 VITALS — BP 113/78 | HR 71 | Temp 99.1°F | Resp 16 | Ht 66.0 in | Wt 167.0 lb

## 2022-10-06 DIAGNOSIS — L0291 Cutaneous abscess, unspecified: Secondary | ICD-10-CM

## 2022-10-06 MED ORDER — CEPHALEXIN 500 MG PO CAPS: 500.0000 mg | ORAL_CAPSULE | Freq: Three times a day (TID) | ORAL | 0 refills | Status: AC

## 2022-10-06 NOTE — ED Notes (Addendum)
Non stick dressing applied to wound.

## 2022-10-06 NOTE — Discharge Instructions (Signed)
May continue the ibuprofen 600 mg with food up to 3 x a day Take the keflex 3 x a day Warm compresses Keep covered until the drainage stops

## 2022-10-06 NOTE — ED Provider Notes (Addendum)
[facility]    CSN: [id number] Arrival date & time: 10/06/22  1144      History   Chief Complaint Chief Complaint  Patient presents with   Abscess    HPI Julie Vaughn is a 36 y.o. female.   HPI Patient has a history of a nodule on her anterior chest.  It is a cyst.  It is usually not bothering her.  Now it has grown, become painful and red. Patient has an abscess on her anterior chest wall.  Is been present several days.  She states that she has been using warm compresses.  Is here for I&D  Past Medical History:  Diagnosis Date   History of multiple miscarriages    Obesity     Patient Active Problem List   Diagnosis Date Noted   Acute upper abdominal pain 04/02/2022   Elevated blood pressure reading 03/15/2019   Class 1 obesity due to excess calories with body mass index (BMI) of 30.0 to 30.9 in adult 03/15/2019   IUD (intrauterine device) in place 03/15/2019   History of multiple miscarriages     Past Surgical History:  Procedure Laterality Date   APPENDECTOMY     INTRAUTERINE DEVICE (IUD) INSERTION      OB History     Gravida  5   Para  2   Term      Preterm      AB  3   Living  2      SAB  3   IAB      Ectopic      Multiple      Live Births               Home Medications    Prior to Admission medications   Medication Sig Start Date End Date Taking? Authorizing Provider  cephALEXin (KEFLEX) 500 MG capsule Take 1 capsule (500 mg total) by mouth 3 (three) times daily. 10/06/22  Yes Raylene Everts, MD  [facility]) IUD IUD 1 each by Intrauterine route once.    [provider]  Multiple Vitamins-Minerals (EMERGEN-C FIVE PO) Take 1 packet by mouth. Take a couple times a week.    [provider]    Family History Family History  Problem Relation Age of Onset   Hypertension Mother    Heart attack Maternal Aunt    Heart attack Maternal Aunt    Cancer Neg Hx     Social  History Social History   Tobacco Use   Smoking status: Never   Smokeless tobacco: Never  Vaping Use   Vaping Use: Never used  Substance Use Topics   Alcohol use: Yes   Drug use: Never     Allergies   Patient has no known allergies.   Review of Systems Review of Systems See HPI  Physical Exam Triage Vital Signs ED Triage Vitals  Enc Vitals Group     BP 10/06/22 1242 113/78     Pulse Rate 10/06/22 1242 71     Resp 10/06/22 1242 16     Temp 10/06/22 1242 99.1 F (37.3 C)     Temp Source 10/06/22 1242 Oral     SpO2 10/06/22 1242 100 %     Weight 10/06/22 1244 167 lb (75.8 kg)     Height 10/06/22 1244 5\' 6"  (1.676 m)     Head Circumference --      Peak Flow --      Pain Score 10/06/22 1243  3     Pain Loc --      Pain Edu? --      Excl. in GC? --    No data found.  Updated Vital Signs BP 113/78 (BP Location: Left Arm)   Pulse 71   Temp 99.1 F (37.3 C) (Oral)   Resp 16   Ht 5\' 6"  (1.676 m)   Wt 75.8 kg   SpO2 100%   BMI 26.95 kg/m      Physical Exam Constitutional:      General: She is not in acute distress.    Appearance: She is well-developed.  HENT:     Head: Normocephalic and atraumatic.  Eyes:     Conjunctiva/sclera: Conjunctivae normal.     Pupils: Pupils are equal, round, and reactive to light.  Cardiovascular:     Rate and Rhythm: Normal rate.  Pulmonary:     Effort: Pulmonary effort is normal. No respiratory distress.  Chest:    Abdominal:     General: There is no distension.     Palpations: Abdomen is soft.  Musculoskeletal:        General: Normal range of motion.     Cervical back: Normal range of motion.  Skin:    General: Skin is warm and dry.  Neurological:     Mental Status: She is alert.      UC Treatments / Results  Labs (all labs ordered are listed, but only abnormal results are displayed) Labs Reviewed - No data to display  EKG   Radiology No results found.  Procedures Incision and Drainage  Date/Time:  10/06/2022 2:41 PM  Performed by: 10/08/2022, MD Authorized by: Eustace Moore, MD   Consent:    Consent obtained:  Verbal   Consent given by:  Patient   Alternatives discussed:  No treatment Universal protocol:    Patient identity confirmed:  Verbally with patient and arm band Location:    Type:  Abscess   Location:  Trunk   Trunk location:  R breast Pre-procedure details:    Skin preparation:  Povidone-iodine Anesthesia:    Anesthesia method:  Local infiltration   Local anesthetic:  Lidocaine 2% WITH epi Procedure type:    Complexity:  Simple Procedure details:    Incision types:  Stab incision   Incision depth:  Subcutaneous   Wound management:  Probed and deloculated   Drainage:  Purulent   Drainage amount:  Copious   Wound treatment:  Wound left open and drain placed   Packing materials:  Wick placed Post-procedure details:    Procedure completion:  Tolerated Comments:     The fluid from the abscess included a mixture of sebum with purulence  (including critical care time)  Medications Ordered in UC Medications - No data to display  Initial Impression / Assessment and Plan / UC Course  I have reviewed the triage vital signs and the nursing notes.  Pertinent labs & imaging results that were available during my care of the patient were reviewed by me and considered in my medical decision making (see chart for details).     Final Clinical Impressions(s) / UC Diagnoses   Final diagnoses:  Abscess     Discharge Instructions      May continue the ibuprofen 600 mg with food up to 3 x a day Take the keflex 3 x a day Warm compresses Keep covered until the drainage stops     ED Prescriptions     Medication Sig  Dispense Auth. Provider   cephALEXin (KEFLEX) 500 MG capsule Take 1 capsule (500 mg total) by mouth 3 (three) times daily. 15 capsule Raylene Everts, MD      PDMP not reviewed this encounter.   Raylene Everts, MD 10/06/22  1442    Raylene Everts, MD 10/06/22 1444

## 2022-10-06 NOTE — ED Triage Notes (Addendum)
R breast abscess -started on Friday  Apple cider on a cotti\on ball   Happened  8 years ago - same size  Drained on it's own  Warm compresses Painful to touch - red  Ibuprofen 600 mg every 6 hours last 24 hrs (0800) Works in [employer] at [employer]

## 2022-10-07 ENCOUNTER — Ambulatory Visit (INDEPENDENT_AMBULATORY_CARE_PROVIDER_SITE_OTHER): Payer: No Typology Code available for payment source | Admitting: Gastroenterology

## 2022-10-07 ENCOUNTER — Encounter: Payer: Self-pay | Admitting: Gastroenterology

## 2022-10-07 ENCOUNTER — Other Ambulatory Visit (INDEPENDENT_AMBULATORY_CARE_PROVIDER_SITE_OTHER): Payer: No Typology Code available for payment source

## 2022-10-07 VITALS — BP 132/82 | HR 89 | Ht 66.0 in | Wt 171.0 lb

## 2022-10-07 DIAGNOSIS — R141 Gas pain: Secondary | ICD-10-CM

## 2022-10-07 DIAGNOSIS — R195 Other fecal abnormalities: Secondary | ICD-10-CM

## 2022-10-07 DIAGNOSIS — R109 Unspecified abdominal pain: Secondary | ICD-10-CM | POA: Diagnosis not present

## 2022-10-07 LAB — TSH: TSH: 1.31 u[IU]/mL (ref 0.35–5.50)

## 2022-10-07 NOTE — Progress Notes (Signed)
10/07/2022 Julie Vaughn [id number] 01-08-1986   HISTORY OF PRESENT ILLNESS:  This is a 36 year old female who is new to our practice.  Has been referred here by her PCP, Dr. Lyda Jester, NP, for evaluation of upper abdominal pain.  She tells me that back in late May/early June she had an episode of severe right-sided abdominal pain with associated diarrhea and a vasovagal episode.  She says that this all followed taking large doses of lysine like 6000 mg daily for about a week.  She says that she read somewhere that you can cause your own gallstones to form by taking too much of that.  Anyway, she had an ultrasound back in June that showed a common bile duct of 5.7 mm without any gallstones or wall thickening visualized and no sonographic Murphy sign.  She then had a HIDA scan that was normal.  CBC, CMP, amylase, lipase were all normal.  She has not had any further episodes of pain.  She says that she also lost about 40 pounds over the course of several months so her PCP thought that may have contributed to her gallbladder issue as well, but once again nothing has been seen on labs or radiographically to prove a gallbladder issue.  She says that she otherwise feels fine.  She does get loose stools on occasion and has a lot of gas, but thinks it may be dietary.   Past Medical History:  Diagnosis Date   History of multiple miscarriages    Obesity    Past Surgical History:  Procedure Laterality Date   APPENDECTOMY     INTRAUTERINE DEVICE (IUD) INSERTION      reports that she has never smoked. She has never used smokeless tobacco. She reports current alcohol use. She reports that she does not use drugs. family history includes Heart attack in her maternal aunt and maternal aunt; Hypertension in her mother. No Known Allergies    Outpatient Encounter Medications as of 10/07/2022  Medication Sig   cephALEXin (KEFLEX) 500 MG capsule Take 1 capsule (500 mg total) by mouth 3  (three) times daily.   Copper (PARAGARD) IUD IUD 1 each by Intrauterine route once.   Multiple Vitamins-Minerals (EMERGEN-C FIVE PO) Take 1 packet by mouth. Take a couple times a week.   No facility-administered encounter medications on file as of 10/07/2022.    REVIEW OF SYSTEMS  : All other systems reviewed and negative except where noted in the History of Present Illness.   PHYSICAL EXAM: BP 132/82   Pulse 89   Ht 5\' 6"  (1.676 m)   Wt 171 lb (77.6 kg)   BMI 27.60 kg/m  General: Well developed white female in no acute distress Head: Normocephalic and atraumatic Eyes:  Sclerae anicteric, conjunctiva pink. Ears: Normal auditory acuity Lungs: Clear throughout to auscultation; no W/R/R. Heart: Regular rate and rhythm; no M/R/G. Abdomen: Soft, non-distended.  BS present.  Non-tender. Musculoskeletal: Symmetrical with no gross deformities  Skin: No lesions on visible extremities Extremities: No edema  Neurological: Alert oriented x 4, grossly non-focal Psychological:  Alert and cooperative. Normal mood and affect  ASSESSMENT AND PLAN: *Right sided abdominal pain:  Had an episode of this back in June.  Thinks she may have passed a gallstone that she may have caused by taking too much Lysine.  No evidence of this by ultrasound, HIDA scan, or labs.  No further episodes of this pain and has stopped taking the Lysine.  Observe  for now. *Loose stools and gas:  This is a separate intermittent issue.  Not new, likely dietary.  Will check TSH and celiac labs.   CC:  Christen Butter, NP

## 2022-10-07 NOTE — Patient Instructions (Signed)
Your provider has requested that you go to the basement level for lab work before leaving today. Press "B" on the elevator. The lab is located at the first door on the left as you exit the elevatorPatient aware of lab results will follow the suggestions outlined in the previous office note.   Due to recent changes in healthcare laws, you may see the results of your imaging and laboratory studies on MyChart before your provider has had a chance to review them.  We understand that in some cases there may be results that are confusing or concerning to you. Not all laboratory results come back in the same time frame and the provider may be waiting for multiple results in order to interpret others.  Please give [place] 48 hours in order for your provider to thoroughly review all the results before contacting the office for clarification of your results.   I appreciate the opportunity to care for you, Doug Sou, PA-C

## 2022-10-08 LAB — IGA: Immunoglobulin A: 277 mg/dL (ref 47–310)

## 2022-10-08 LAB — TISSUE TRANSGLUTAMINASE, IGA: (tTG) Ab, IgA: <1 U/mL

## 2022-10-11 ENCOUNTER — Encounter: Payer: Self-pay | Admitting: Sports Medicine

## 2022-10-11 ENCOUNTER — Ambulatory Visit (INDEPENDENT_AMBULATORY_CARE_PROVIDER_SITE_OTHER): Payer: No Typology Code available for payment source | Admitting: Sports Medicine

## 2022-10-11 VITALS — BP 124/84 | HR 74 | Wt 171.0 lb

## 2022-10-11 DIAGNOSIS — N611 Abscess of the breast and nipple: Secondary | ICD-10-CM | POA: Diagnosis not present

## 2022-10-11 DIAGNOSIS — N62 Hypertrophy of breast: Secondary | ICD-10-CM | POA: Diagnosis not present

## 2022-10-11 MED ORDER — DOXYCYCLINE HYCLATE 100 MG PO TABS: 100.0000 mg | ORAL_TABLET | Freq: Two times a day (BID) | ORAL | 0 refills | Status: AC

## 2022-10-11 NOTE — Assessment & Plan Note (Signed)
36 year old female, recent abscess right breast upper aspect, status post I&D in urgent care, no cultures obtained. She still has some redness, warmth and fullness but no fluctuance, switching to doxycycline, she will do warm compresses and return to see me as needed.

## 2022-10-11 NOTE — Progress Notes (Signed)
    Procedures performed today:    None.  Independent interpretation of notes and tests performed by another provider:   None.  Brief History, Exam, Impression, and Recommendations:    Abscess of right breast 36 year old female, recent abscess right breast upper aspect, status post I&D in urgent care, no cultures obtained. She still has some redness, warmth and fullness but no fluctuance, switching to doxycycline, she will do warm compresses and return to see me as needed.  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  Chronic/recurrent process with exacerbation and pharmacologic intervention  ____________________________________________ Ihor [place]. Benjamin Stain, M.D., ABFM., CAQSM., AME. Primary Care and Sports Medicine [facility]  Adjunct Professor of Family Medicine  [place] [employer]  Restaurant manager, fast food

## 2022-10-11 NOTE — Assessment & Plan Note (Addendum)
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

## 2022-10-15 ENCOUNTER — Encounter: Payer: Self-pay | Admitting: Gastroenterology

## 2022-10-15 DIAGNOSIS — R141 Gas pain: Secondary | ICD-10-CM | POA: Insufficient documentation

## 2022-10-15 DIAGNOSIS — R195 Other fecal abnormalities: Secondary | ICD-10-CM | POA: Insufficient documentation

## 2022-10-15 DIAGNOSIS — R109 Unspecified abdominal pain: Secondary | ICD-10-CM | POA: Insufficient documentation

## 2022-10-18 NOTE — Progress Notes (Signed)
I agree with the assessment and plan as outlined by Ms. Zehr. 

## 2022-10-19 ENCOUNTER — Encounter: Payer: Self-pay | Admitting: Sports Medicine

## 2022-10-19 ENCOUNTER — Other Ambulatory Visit: Payer: Self-pay

## 2022-10-19 DIAGNOSIS — N611 Abscess of the breast and nipple: Secondary | ICD-10-CM

## 2022-10-19 MED ORDER — FLUCONAZOLE 150 MG PO TABS
150.0000 mg | ORAL_TABLET | Freq: Once | ORAL | 0 refills | Status: AC
  Filled 2022-10-19: qty 1, 1d supply, fill #0

## 2022-10-19 MED ORDER — SULFAMETHOXAZOLE-TRIMETHOPRIM 800-160 MG PO TABS
1.0000 | ORAL_TABLET | Freq: Two times a day (BID) | ORAL | 0 refills | Status: DC
  Filled 2022-10-19: qty 20, 10d supply, fill #0

## 2022-10-19 MED ORDER — FLUCONAZOLE 150 MG PO TABS: 150.0000 mg | ORAL_TABLET | Freq: Once | ORAL | 0 refills | Status: DC

## 2022-10-19 MED ORDER — SULFAMETHOXAZOLE-TRIMETHOPRIM 800-160 MG PO TABS: 1.0000 | ORAL_TABLET | Freq: Two times a day (BID) | ORAL | 0 refills | Status: DC

## 2022-10-19 NOTE — Assessment & Plan Note (Signed)
Patient sent a MyChart message, it looks like symptoms are recurring, adding Diflucan and a prolonged course of Septra.

## 2022-10-19 NOTE — Addendum Note (Signed)
Addended by: Tonny Bollman on: 10/19/2022 01:45 PM   Modules accepted: Orders

## 2022-10-21 ENCOUNTER — Encounter: Payer: Self-pay | Admitting: Sports Medicine

## 2022-10-21 ENCOUNTER — Ambulatory Visit (INDEPENDENT_AMBULATORY_CARE_PROVIDER_SITE_OTHER): Payer: No Typology Code available for payment source | Admitting: Sports Medicine

## 2022-10-21 VITALS — BP 117/76 | HR 77 | Wt 171.0 lb

## 2022-10-21 DIAGNOSIS — Z9889 Other specified postprocedural states: Secondary | ICD-10-CM | POA: Diagnosis not present

## 2022-10-21 DIAGNOSIS — N611 Abscess of the breast and nipple: Secondary | ICD-10-CM

## 2022-10-21 MED ORDER — HYDROCODONE-ACETAMINOPHEN 10-325 MG PO TABS: 1.0000 | ORAL_TABLET | Freq: Three times a day (TID) | ORAL | 0 refills | Status: DC | PRN

## 2022-10-21 NOTE — Assessment & Plan Note (Signed)
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 Unfortunately she is having recurrence, I have performed an additional aggressive incision and drainage removing a great deal of purulence, this was packed with approximately 18 inches of quarter inch iodoform packing. She will continue her Septra, I did obtain cultures today however she has been on antibiotics so I am not confident that we will grow anything out. Adding hydrocodone for postop pain. Return to see me in 2 weeks.

## 2022-10-21 NOTE — Progress Notes (Signed)
    Procedures performed today:    Complicated Incision and drainage of abscess right breast intermammary cleft. Risks, benefits, and alternatives explained and consent obtained. Time out conducted. Surface cleaned with alcohol. 5cc lidocaine with epinephine infiltrated around abscess. Adequate anesthesia ensured. Area prepped and draped in a sterile fashion. #11 blade used to make a stab incision into abscess. Pus expressed with pressure. Curved hemostat used to explore 4 quadrants and loculations broken up. Further purulence expressed. Iodoform packing placed leaving a 1-inch tail. Hemostasis achieved. Pt stable. Aftercare and follow-up advised.  Independent interpretation of notes and tests performed by another provider:   None.  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 Unfortunately she is having recurrence, I have performed an additional aggressive incision and drainage removing a great deal of purulence, this was packed with approximately 18 inches of quarter inch iodoform packing. She will continue her Septra, I did obtain cultures today however she has been on antibiotics so I am not confident that we will grow anything out. Adding hydrocodone for postop pain. Return to see me in 2 weeks.    ____________________________________________ Ihor [place]. Benjamin Stain, M.D., ABFM., CAQSM., AME. Primary Care and Sports Medicine [facility]  Adjunct Professor of Family Medicine  [employer]  Restaurant manager, fast food

## 2022-10-21 NOTE — Patient Instructions (Signed)
Incision and Drainage, Care After This sheet gives you information about how to care for yourself after your procedure. Your health care provider may also give you more specific instructions. If you have problems or questions, contact your health care provider. What can I expect after the procedure? After the procedure, it is common to have: Pain or discomfort around the incision site. Blood, fluid, or pus (drainage) from the incision. Redness and firm skin around the incision site. Follow these instructions at home: Medicines Take over-the-counter and prescription medicines only as told by your health care provider. If you were prescribed an antibiotic medicine, use or take it as told by your health care provider. Do not stop using the antibiotic even if you start to feel better. Wound care Follow instructions from your health care provider about how to take care of your wound. Make sure you: Wash your hands with soap and water before and after you change your bandage (dressing). If soap and water are not available, use hand sanitizer. Change your dressing and packing as told by your health care provider. If your dressing is dry or stuck when you try to remove it, moisten or wet the dressing with saline or water so that it can be removed without harming your skin or tissues. If your wound is packed, leave it in place until your health care provider tells you to remove it. To remove the packing, moisten or wet the packing with saline or water so that it can be removed without harming your skin or tissues. Leave stitches (sutures), skin glue, or adhesive strips in place. These skin closures may need to stay in place for 2 weeks or longer. If adhesive strip edges start to loosen and curl up, you may trim the loose edges. Do not remove adhesive strips completely unless your health care provider tells you to do that. Check your wound every day for signs of infection. Check for: More redness, swelling,  or pain. More fluid or blood. Warmth. Pus or a bad smell. If you were sent home with a drain tube in place, follow instructions from your health care provider about: How to empty it. How to care for it at home.  General instructions Rest the affected area. Do not take baths, swim, or use a hot tub until your health care provider approves. Ask your health care provider if you may take showers. You may only be allowed to take sponge baths. Return to your normal activities as told by your health care provider. Ask your health care provider what activities are safe for you. Your health care provider may put you on activity or lifting restrictions. The incision will continue to drain. It is normal to have some clear or slightly bloody drainage. The amount of drainage should lessen each day. Do not apply any creams, ointments, or liquids unless you have been told to by your health care provider. Keep all follow-up visits as told by your health care provider. This is important. Contact a health care provider if: Your cyst or abscess returns. You have more redness, swelling, or pain around your incision. You have more fluid or blood coming from your incision. Your incision feels warm to the touch. You have pus or a bad smell coming from your incision. You have red streaks above or below the incision site. Get help right away if: You have severe pain or bleeding. You cannot eat or drink without vomiting. You have a fever or chills. You have redness that spreads   quickly. You have decreased urine output. You become short of breath. You have chest pain. You cough up blood. The affected area becomes numb or starts to tingle. These symptoms may represent a serious problem that is an emergency. Do not wait to see if the symptoms will go away. Get medical help right away. Call your local emergency services (911 in the [place] Do not drive yourself to the hospital. Summary After this procedure, it is  common to have fluid, blood, or pus coming from the surgery site. Follow all home care instructions. You will be told how to take care of your incision, how to check for infection, and how to take medicines. If you were prescribed an antibiotic medicine, take it as told by your health care provider. Do not stop taking the antibiotic even if you start to feel better. Contact a health care provider if you have increased redness, swelling, or pain around your incision. Get help right away if you have chest pain, you vomit, you cough up blood, or you have shortness of breath. Keep all follow-up visits as told by your health care provider. This is important. This information is not intended to replace advice given to you by your health care provider. Make sure you discuss any questions you have with your health care provider. Document Revised: 01/28/2022 Document Reviewed: 08/06/2021 Elsevier Patient Education  2023 Elsevier Inc.  

## 2022-10-25 ENCOUNTER — Encounter: Payer: Self-pay | Admitting: Sports Medicine

## 2022-10-25 LAB — WOUND CULTURE
MICRO NUMBER:: 14319739
RESULT:: NO GROWTH
SPECIMEN QUALITY:: ADEQUATE

## 2022-10-25 MED ORDER — TRAMADOL HCL 50 MG PO TABS: 50.0000 mg | ORAL_TABLET | Freq: Three times a day (TID) | ORAL | 0 refills | Status: AC | PRN

## 2022-10-27 ENCOUNTER — Ambulatory Visit: Payer: No Typology Code available for payment source | Admitting: Sports Medicine

## 2022-11-04 ENCOUNTER — Encounter: Payer: Self-pay | Admitting: Sports Medicine

## 2022-11-04 ENCOUNTER — Ambulatory Visit (INDEPENDENT_AMBULATORY_CARE_PROVIDER_SITE_OTHER): Payer: No Typology Code available for payment source | Admitting: Sports Medicine

## 2022-11-04 VITALS — BP 119/78 | HR 81 | Wt 171.0 lb

## 2022-11-04 DIAGNOSIS — Z6830 Body mass index (BMI) 30.0-30.9, adult: Secondary | ICD-10-CM | POA: Diagnosis not present

## 2022-11-04 DIAGNOSIS — E6609 Other obesity due to excess calories: Secondary | ICD-10-CM | POA: Diagnosis not present

## 2022-11-04 DIAGNOSIS — N611 Abscess of the breast and nipple: Secondary | ICD-10-CM

## 2022-11-04 MED ORDER — SULFAMETHOXAZOLE-TRIMETHOPRIM 800-160 MG PO TABS: 1.0000 | ORAL_TABLET | Freq: Two times a day (BID) | ORAL | 0 refills | Status: AC

## 2022-11-04 NOTE — Progress Notes (Signed)
    Procedures performed today:    None.  Independent interpretation of notes and tests performed by another provider:   None.  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 She is doing a lot better, no fluctuance, very minimal erythema. I have asked her to keep a close eye and draw a line around the slight amount of remaining erythema and if it does not continue to improve daily she will do an additional course of Septra which we called in now. Return to see me as needed for this.  Class 1 obesity due to excess calories with body mass index (BMI) of 30.0 to 30.9 in adult She does have a consultation coming up with plastic surgery for reduction mammoplasty, we did discuss the potential of some additional medical weight loss loss before considering mammoplasty, she will think about it and let me know.    ____________________________________________ Ihor Austin. Benjamin Stain, M.D., ABFM., CAQSM., AME. Primary Care and Sports Medicine [facility]  Adjunct Professor of Family Medicine  [employer]  Restaurant manager, fast food

## 2022-11-04 NOTE — Assessment & Plan Note (Signed)
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 She is doing a lot better, no fluctuance, very minimal erythema. I have asked her to keep a close eye and draw a line around the slight amount of remaining erythema and if it does not continue to improve daily she will do an additional course of Septra which we called in now. Return to see me as needed for this.

## 2022-11-04 NOTE — Assessment & Plan Note (Signed)
She does have a consultation coming up with plastic surgery for reduction mammoplasty, we did discuss the potential of some additional medical weight loss loss before considering mammoplasty, she will think about it and let me know.

## 2022-11-12 ENCOUNTER — Encounter: Payer: Self-pay | Admitting: Sports Medicine

## 2022-11-30 ENCOUNTER — Ambulatory Visit: Payer: 59 | Admitting: Plastic Surgery

## 2022-11-30 ENCOUNTER — Encounter: Payer: Self-pay | Admitting: Plastic Surgery

## 2022-11-30 VITALS — BP 130/84 | HR 74 | Resp 18 | Ht 66.0 in | Wt 173.0 lb

## 2022-11-30 DIAGNOSIS — G8929 Other chronic pain: Secondary | ICD-10-CM

## 2022-11-30 DIAGNOSIS — M549 Dorsalgia, unspecified: Secondary | ICD-10-CM | POA: Insufficient documentation

## 2022-11-30 DIAGNOSIS — R21 Rash and other nonspecific skin eruption: Secondary | ICD-10-CM | POA: Diagnosis not present

## 2022-11-30 DIAGNOSIS — M546 Pain in thoracic spine: Secondary | ICD-10-CM

## 2022-11-30 DIAGNOSIS — Z6827 Body mass index (BMI) 27.0-27.9, adult: Secondary | ICD-10-CM | POA: Diagnosis not present

## 2022-11-30 DIAGNOSIS — M545 Low back pain, unspecified: Secondary | ICD-10-CM | POA: Diagnosis not present

## 2022-11-30 DIAGNOSIS — N62 Hypertrophy of breast: Secondary | ICD-10-CM | POA: Insufficient documentation

## 2022-11-30 DIAGNOSIS — N611 Abscess of the breast and nipple: Secondary | ICD-10-CM

## 2022-11-30 DIAGNOSIS — M542 Cervicalgia: Secondary | ICD-10-CM

## 2022-11-30 NOTE — Progress Notes (Signed)
Patient ID: Julie Vaughn, female    DOB: Jun 11, 1986, 37 y.o.   MRN: [id number]   Chief Complaint  Patient presents with   Consult   Breast Problem    Mammary Hyperplasia: The patient is a 37 y.o. female with a history of mammary hyperplasia for several years.  She has extremely large breasts causing symptoms that include the following: Back pain in the upper and lower back, including neck pain. She pulls or pins her bra straps to provide better lift and relief of the pressure and pain. She notices relief by holding her breast up manually.  Her shoulder straps cause grooves and pain and pressure that requires padding for relief. Pain medication is sometimes required with motrin and tylenol.  Activities that are hindered by enlarged breasts include: exercise and running.  She has tried supportive clothing as well as fitted bras without improvement.  Her breasts are extremely large and fairly symmetric.  She has hyperpigmentation of the inframammary area on both sides.  The sternal to nipple distance on the right is 31 cm and the left is 31 cm.  The IMF distance is 14 cm.  She is 5 feet 6 inches tall and weighs 173 pounds.  The BMI = 27.9 kg/m.  Preoperative bra size = 34H cup.  She would like to be much smaller.  She did not give a specific size.   the estimated excess breast tissue to be removed at the time of surgery = 485 grams on the left and 485 grams on the right.  Mammogram history: None.  Family history of breast cancer: None.  Tobacco use: None.   The patient expresses the Umi to pursue surgical intervention.  The patient does have an area on the right medial breast that abscess is every couple of years and has to be drained.     Review of Systems  Constitutional: Negative.   HENT: Negative.    Eyes: Negative.   Respiratory: Negative.  Negative for chest tightness and shortness of breath.   Cardiovascular: Negative.   Gastrointestinal: Negative.   Endocrine: Negative.    Genitourinary: Negative.   Musculoskeletal:  Positive for back pain and neck pain.  Skin:  Positive for rash.    Past Medical History:  Diagnosis Date   History of multiple miscarriages    Obesity     Past Surgical History:  Procedure Laterality Date   APPENDECTOMY     INTRAUTERINE DEVICE (IUD) INSERTION        Current Outpatient Medications:    cephALEXin (KEFLEX) 500 MG capsule, Take 1 capsule (500 mg total) by mouth 3 (three) times daily., Disp: 15 capsule, Rfl: 0   Copper (PARAGARD) IUD IUD, 1 each by Intrauterine route once., Disp: , Rfl:    Multiple Vitamins-Minerals (EMERGEN-C FIVE PO), Take 1 packet by mouth. Take a couple times a week., Disp: , Rfl:    sulfamethoxazole-trimethoprim (BACTRIM DS) 800-160 MG tablet, Take 1 tablet by mouth 2 (two) times daily., Disp: 20 tablet, Rfl: 0   traMADol (ULTRAM) 50 MG tablet, Take 1 tablet (50 mg total) by mouth every 8 (eight) hours as needed for moderate pain., Disp: 21 tablet, Rfl: 0   Objective:   Vitals:   11/30/22 0916  BP: 130/84  Pulse: 74  Resp: 18  SpO2: 100%    Physical Exam Vitals and nursing note reviewed.  Constitutional:      Appearance: Normal appearance.  HENT:     Head: Normocephalic and  atraumatic.  Cardiovascular:     Rate and Rhythm: Normal rate.     Pulses: Normal pulses.  Abdominal:     General: There is no distension.     Palpations: Abdomen is soft. There is no mass.     Tenderness: There is no abdominal tenderness.  Musculoskeletal:        General: No swelling or deformity.  Skin:    General: Skin is warm.     Capillary Refill: Capillary refill takes less than 2 seconds.     Coloration: Skin is not jaundiced.     Findings: Erythema and lesion present. No bruising.  Neurological:     Mental Status: She is alert and oriented to person, place, and time.  Psychiatric:        Mood and Affect: Mood normal.        Behavior: Behavior normal.        Thought Content: Thought content normal.      Assessment & Plan:  Abscess of right breast  Symptomatic mammary hypertrophy  Chronic bilateral thoracic back pain  Neck pain  The procedure the patient selected and that was best for the patient was discussed. The risk were discussed and include but not limited to the following:  Breast asymmetry, fluid accumulation, firmness of the breast, inability to breast feed, loss of nipple or areola, skin loss, change in skin and nipple sensation, fat necrosis of the breast tissue, bleeding, infection and healing delay.  There are risks of anesthesia and injury to nerves or blood vessels.  Allergic reaction to tape, suture and skin glue are possible.  There will be swelling.  Any of these can lead to the need for revisional surgery.  A breast reduction has potential to interfere with diagnostic procedures in the future.  This procedure is best done when the breast is fully developed.  Changes in the breast will continue to occur over time: pregnancy, weight gain or weigh loss.    Total time: 40 minutes. This includes time spent with the patient during the visit as well as time spent before and after the visit reviewing the chart, documenting the encounter, ordering pertinent studies and literature for the patient.   Physical therapy:  ordered Mammogram:  not indicated Healthy Weight and Wellness:  referral placed  Patient is a very good candidate for bilateral breast reduction.  She will go through physical therapy and we will plan to see her back in 8 weeks.  If all is the same we will submit for a bilateral breast reduction with possible lateral liposuction.  Pictures were obtained of the patient and placed in the chart with the patient's or guardian's permission.   [place], DO

## 2022-11-30 NOTE — Addendum Note (Signed)
Addended by: Lilli Light on: 11/30/2022 11:25 AM   Modules accepted: Orders

## 2022-12-06 ENCOUNTER — Ambulatory Visit: Payer: 59 | Attending: Plastic Surgery

## 2022-12-06 DIAGNOSIS — M5459 Other low back pain: Secondary | ICD-10-CM | POA: Diagnosis not present

## 2022-12-06 DIAGNOSIS — M546 Pain in thoracic spine: Secondary | ICD-10-CM | POA: Diagnosis not present

## 2022-12-06 DIAGNOSIS — M6281 Muscle weakness (generalized): Secondary | ICD-10-CM | POA: Diagnosis not present

## 2022-12-06 DIAGNOSIS — M542 Cervicalgia: Secondary | ICD-10-CM | POA: Insufficient documentation

## 2022-12-06 DIAGNOSIS — G8929 Other chronic pain: Secondary | ICD-10-CM | POA: Diagnosis not present

## 2022-12-06 DIAGNOSIS — N62 Hypertrophy of breast: Secondary | ICD-10-CM | POA: Diagnosis not present

## 2022-12-06 DIAGNOSIS — N611 Abscess of the breast and nipple: Secondary | ICD-10-CM | POA: Diagnosis not present

## 2022-12-06 NOTE — Therapy (Signed)
OUTPATIENT PHYSICAL THERAPY THORACOLUMBAR EVALUATION   Patient Name: Julie Vaughn MRN: [id number] DOB:1986/08/15, 37 y.o., female Today's Date: 12/06/2022  END OF SESSION:   Past Medical History:  Diagnosis Date   History of multiple miscarriages    Obesity    Past Surgical History:  Procedure Laterality Date   APPENDECTOMY     INTRAUTERINE DEVICE (IUD) INSERTION     Patient Active Problem List   Diagnosis Date Noted   Symptomatic mammary hypertrophy 11/30/2022   Back pain 11/30/2022   Neck pain 11/30/2022   Gas pain 10/15/2022   Loose stools 10/15/2022   Right sided abdominal pain 10/15/2022   Abscess of right breast 10/11/2022   Macromastia 10/11/2022   Acute upper abdominal pain 04/02/2022   Elevated blood pressure reading 03/15/2019   Class 1 obesity due to excess calories with body mass index (BMI) of 30.0 to 30.9 in adult 03/15/2019   IUD (intrauterine device) in place 03/15/2019   History of multiple miscarriages     PCP: Monica Becton, MD   REFERRING PROVIDER: Peggye Form, DO   REFERRING DIAG:  N61.1 (ICD-10-CM) - Abscess of right breast  N62 (ICD-10-CM) - Symptomatic mammary hypertrophy  M54.6,G89.29 (ICD-10-CM) - Chronic bilateral thoracic back pain  M54.2 (ICD-10-CM) - Neck pain    Rationale for Evaluation and Treatment: Rehabilitation  THERAPY DIAG:  No diagnosis found.  ONSET DATE: 17 years ago  SUBJECTIVE:                                                                                                                                                                                           SUBJECTIVE STATEMENT:   Pt presents to PT eval for chronic neck and back pain (upper and lower back) with dx of mammary hypertrophy.    PERTINENT HISTORY:   Pt is a pleasant 37 yo female referred to PT with dx of mammary hypertrophy, neck pain, and back pain. Pt reports pain started 17 years ago. She reports she experiences back  pain throughout entire day, affecting her work day and her ability to sleep through the night. She currently manages her pain with ibuprofen. Pt also with hx of recurrent abscesses between her breasts. She has been told not to wear bras with underwire as a result, but now must wear multiple sports brass to improve support/comfort. Her low back pain is currently 6/10. Pt also with chronic neck pain with occ R UE numbness. She has experienced RUE weakness in the past associated with her pain. She reports no LE weakness or issues with balance. Pt was attending crossfit at one time and reports this  reduced her pain some but did not resolve it. Pt it trialing PT prior to breast reduction surgery. 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 Per chart, PMH significant for the following: elevated BP reading, hx of multiple miscarriages, abscess of R breast, macromastia, symptomatic mammary hypertrophy, back pain, neck pain, hx of appendectomy.  PAIN:  Are you having pain? Yes: NPRS scale: low back 6/10 Pain location: neck, low back and upper back Pain description: aching, sharp "it wakes me up" Aggravating factors: activity Relieving factors: ibuprofen  PRECAUTIONS: Pt with hx of abscesses between breasts.   WEIGHT BEARING RESTRICTIONS: No  FALLS:  Has patient fallen in last 6 months? 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  NEXT MD VISIT: Pt has a follow-up appointment with her PA in March.  OBJECTIVE:   DIAGNOSTIC FINDINGS:  No recent pertinent imaging per chart  PATIENT SURVEYS:  Modified Oswestry 19%, indicating moderate disability  NDI 26%, indicating moderate disability FOTO 58  SCREENING FOR RED FLAGS: Bowel or bladder incontinence:  No Spinal tumors: No Cauda equina syndrome: No Compression fracture: No Abdominal aneurysm: No  COGNITION: Overall cognitive status: Within functional limits for tasks assessed     SENSATION: Reports occ numbness in RUE BUE/BLE testing in tact to light touch   MUSCLE LENGTH: deferred  POSTURE: rounded shoulders and forward head  PALPATION: No pain and tenderness with palpation throughout B cervical paraspinals, B thoracic or lumbar paraspinals, no pain with palpation to SIJ . Hypomobility found C6/C7 and throughout upper thoracic spine.  LUMBAR ROM:   AROM eval  Flexion 5-10% limited, painful at end range*   Extension 5-10% limitation  Right lateral flexion WFL but painful at end range*  Left lateral flexion WFL but painful at end range*   Right rotation [facility]  Left rotation WFL   (Blank rows = not tested) *Pain with bilat lateral flexion and flexion  UE MMT: Grossly 4+/5 BUE  LOWER EXTREMITY MMT:    Pain felt in low back with testing L hip flexion BLE strength grossly 4+/5    CERVICAL AROM:  Lateral flexion: 45 deg L, feels pull Lateral flexion: 50 deg R  Rotation R 85 deg Rotation L 72 deg - limited  Flexion: 65 deg  Extension: 54 deg - limited    LUMBAR SPECIAL TESTS:  deferred  FUNCTIONAL TESTS:   GAIT: Distance walked: clinic distances Assistive device utilized: None Level of assistance: Complete Independence Comments: pt with postural impairments of rounded shoulders/forward head posture, formal gait assessment to be completed future visit  TODAY'S TREATMENT:  DATE: eval only    PATIENT EDUCATION:  Education details: exam findings, indications, plan Person educated: Patient Education method: Explanation Education comprehension: verbalized understanding  HOME EXERCISE PROGRAM: To be initiated next 1-2  visits  ASSESSMENT:  CLINICAL IMPRESSION: Patient is a pleasant 37 y.o. female who was seen today for physical therapy evaluation and treatment for neck pain and back pain with other referred diagnosis of mammary hypertrophy. Examination reveals deficits in pain, lumbar and cervical ROM/mobility, and decreased BUE and BLE strength. Modified Oswestry and NDI scores both indicate pt with moderate disability due to back and neck pain. The pt will benefit from further skilled PT to address these deficits in order to increase functional mobility, decrease pain and improve QOL.   OBJECTIVE IMPAIRMENTS: decreased activity tolerance, decreased mobility, difficulty walking, decreased ROM, hypomobility, impaired sensation, postural dysfunction, and pain.   ACTIVITY LIMITATIONS: bending, bed mobility, locomotion level, and general affect on ADLs with increased pain  PARTICIPATION LIMITATIONS: cleaning, laundry, shopping, community activity, occupation, and yard work  PERSONAL FACTORS: Sex, Time since onset of injury/illness/exacerbation, and 1-2 comorbidities: PMH significant for the following: elevated BP reading, hx of multiple miscarriages, abscess of R breast, macromastia, symptomatic mammary hypertrophy, back pain, neck pain, hx of appendectomy.  are also affecting patient's functional outcome.   REHAB POTENTIAL: Fair    CLINICAL DECISION MAKING: Evolving/moderate complexity  EVALUATION COMPLEXITY: Moderate   GOALS: Goals reviewed with patient? Yes  SHORT TERM GOALS: Target date: 12/27/2022    Patient will be independent in home exercise program to improve strength/mobility for better functional independence with ADLs. Baseline: to be initiated next 1-2 visits  Goal status: INITIAL   LONG TERM GOALS: Target date: 01/17/2023   Patient will increase FOTO score to equal to or greater than  71  to demonstrate statistically significant improvement in mobility and quality of life.  Baseline:  58 Goal status: INITIAL  2.   Patient will report a worst pain of 3/10 on VAS in             to improve tolerance with ADLs and reduced symptoms with activities.  Baseline: pt presents with 6/10 pain following taking pain medication Goal status: INITIAL  3.    Patient will reduce modified Oswestry score to <18 as to demonstrate minimal disability with ADLs including improved sleeping tolerance, walking/sitting tolerance etc for better mobility with ADLs.  Baseline: 19% Goal status: INITIAL  4.  Patient will reduce Neck Disability Index score to <10% to demonstrate minimal disability with ADL's including improved sleeping tolerance, sitting tolerance, etc for better mobility at home and work. Baseline: 26% Goal status: INITIAL    PLAN:  PT FREQUENCY: 2x/week  PT DURATION: 6 weeks  PLANNED INTERVENTIONS: Therapeutic exercises, Therapeutic activity, Neuromuscular re-education, Balance training, Gait training, Patient/Family education, Self Care, Joint mobilization, Joint manipulation, Stair training, Vestibular training, Canalith repositioning, Orthotic/Fit training, DME instructions, Dry Needling, Electrical stimulation, Spinal mobilization, Cryotherapy, Moist heat, scar mobilization, Splintting, Taping, Traction, and Manual therapy.  PLAN FOR NEXT SESSION: initiate HEP, muscle length testing, strengthening, stretching, manual as indicated   Zollie Pee, PT 12/06/2022, 3:39 PM

## 2022-12-09 ENCOUNTER — Ambulatory Visit: Payer: 59 | Attending: Plastic Surgery

## 2022-12-09 DIAGNOSIS — M542 Cervicalgia: Secondary | ICD-10-CM

## 2022-12-09 DIAGNOSIS — M6281 Muscle weakness (generalized): Secondary | ICD-10-CM | POA: Diagnosis not present

## 2022-12-09 DIAGNOSIS — M5459 Other low back pain: Secondary | ICD-10-CM | POA: Insufficient documentation

## 2022-12-09 NOTE — Therapy (Signed)
OUTPATIENT PHYSICAL THERAPY THORACOLUMBAR TREATMENT NOTE   Patient Name: Julie Vaughn MRN: [id number] DOB:12-Sep-1986, 37 y.o., female Today's Date: 12/09/2022  END OF SESSION:  PT End of Session - 12/09/22 1733     Visit Number 2    Number of Visits 13    Date for PT Re-Evaluation 01/17/23    PT Start Time 1647    PT Stop Time 1730    PT Time Calculation (min) 43 min    Activity Tolerance Patient tolerated treatment well    Behavior During Therapy [facility] for tasks assessed/performed             Past Medical History:  Diagnosis Date   History of multiple miscarriages    Obesity    Past Surgical History:  Procedure Laterality Date   APPENDECTOMY     INTRAUTERINE DEVICE (IUD) INSERTION     Patient Active Problem List   Diagnosis Date Noted   Symptomatic mammary hypertrophy 11/30/2022   Back pain 11/30/2022   Neck pain 11/30/2022   Gas pain 10/15/2022   Loose stools 10/15/2022   Right sided abdominal pain 10/15/2022   Abscess of right breast 10/11/2022   Macromastia 10/11/2022   Acute upper abdominal pain 04/02/2022   Elevated blood pressure reading 03/15/2019   Class 1 obesity due to excess calories with body mass index (BMI) of 30.0 to 30.9 in adult 03/15/2019   IUD (intrauterine device) in place 03/15/2019   History of multiple miscarriages     PCP: Silverio Decamp, MD   REFERRING PROVIDER: Wallace Going, DO   REFERRING DIAG:  N61.1 (ICD-10-CM) - Abscess of right breast  N62 (ICD-10-CM) - Symptomatic mammary hypertrophy  M54.6,G89.29 (ICD-10-CM) - Chronic bilateral thoracic back pain  M54.2 (ICD-10-CM) - Neck pain    Rationale for Evaluation and Treatment: Rehabilitation  THERAPY DIAG:  Other low back pain  Cervicalgia  Muscle weakness (generalized)  ONSET DATE: 17 years ago  SUBJECTIVE:                                                                                                                                                                                            SUBJECTIVE STATEMENT:   Pt reports she has usual pains: low and upper back. No other updates.    PERTINENT HISTORY:   Pt is a pleasant 37 yo female referred to PT with dx of mammary hypertrophy, neck pain, and back pain. Pt reports pain started 17 years ago. She reports she experiences back pain throughout entire day, affecting her work day and her ability to sleep through the night. She currently manages her pain with  ibuprofen. Pt also with hx of recurrent abscesses between her breasts. She has been told not to wear bras with underwire as a result, but now must wear multiple sports brass to improve support/comfort. Her low back pain is currently 6/10. Pt also with chronic neck pain with occ R UE numbness. She has experienced RUE weakness in the past associated with her pain. She reports no LE weakness or issues with balance. Pt was attending crossfit at one time and reports this reduced her pain some but did not resolve it. Pt it trialing PT prior to breast reduction surgery. Pt is a Producer, television/film/video and works in Building surveyor. Per chart, PMH significant for the following: elevated BP reading, hx of multiple miscarriages, abscess of R breast, macromastia, symptomatic mammary hypertrophy, back pain, neck pain, hx of appendectomy.  PAIN:  Are you having pain? Yes: NPRS scale: low back 6/10 Pain location: neck, low back and upper back Pain description: aching, sharp "it wakes me up" Aggravating factors: activity Relieving factors: ibuprofen  PRECAUTIONS: Pt with hx of abscesses between breasts.   WEIGHT BEARING RESTRICTIONS: No  FALLS:  Has patient fallen in last 6 months? 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  NEXT MD VISIT: Pt has a follow-up appointment with her PA in March.  OBJECTIVE:   DIAGNOSTIC FINDINGS:  No recent pertinent imaging per chart  PATIENT SURVEYS:  Modified Oswestry 19%, indicating moderate disability  NDI 26%, indicating moderate disability FOTO 58  SCREENING FOR RED FLAGS: Bowel or bladder incontinence: No Spinal tumors: No Cauda equina syndrome: No Compression fracture: No Abdominal aneurysm: No  COGNITION: Overall cognitive status: Within functional limits for tasks assessed     SENSATION: Reports occ numbness in RUE BUE/BLE testing in tact to light touch   MUSCLE LENGTH: deferred  POSTURE: rounded shoulders and forward head  PALPATION: No pain and tenderness with palpation throughout B cervical paraspinals, B thoracic or lumbar paraspinals, no pain with palpation to SIJ . Hypomobility found C6/C7 and throughout upper thoracic spine.  LUMBAR ROM:   AROM eval  Flexion 5-10% limited, painful at end range*   Extension 5-10% limitation  Right lateral flexion WFL but painful at end range*  Left lateral flexion WFL but painful at end range*   Right rotation [facility]  Left rotation WFL   (Blank rows = not tested) *Pain with bilat lateral flexion and flexion  UE MMT: Grossly 4+/5 BUE  LOWER EXTREMITY MMT:    Pain felt in low back with testing L hip flexion BLE strength grossly 4+/5    CERVICAL AROM:  Lateral flexion: 45 deg L, feels pull Lateral flexion: 50 deg R  Rotation R 85 deg Rotation L 72 deg - limited  Flexion: 65 deg  Extension: 54 deg - limited    LUMBAR SPECIAL TESTS:  deferred  FUNCTIONAL TESTS:     hamstring length: lacking 30 deg  LLE, lacking 35 deg RLE.   GAIT: Distance walked: clinic distances Assistive device utilized: None Level of assistance: Complete Independence Comments: pt with postural impairments of rounded shoulders/forward head posture, formal gait assessment to be completed future  visit  TODAY'S TREATMENT:  DATE:    Seated stability ball rollouts FWD/BCKWD/LTL x 2 min for each   Seated thoracic ext over chair (bolster placed behind pt) 2x10. Some discomfort in low back until cued to modify range.  Seated thoracic rotation 10x each direction   Scapular squeezes 10 x 3 sec holds  Matrix cable machine rows 2.5# 10x, 12.5# 10x bilat UE  Wall push-ups 10x   On plinth:  hamstring length: lacking 30 deg  LLE, lacking 35 deg RLE.   Supine piriformis stretch 2x30 sec each LE. Pt reports interventions feels good  LTRs x 3 min bilaterally. Reports feels good  Open book - 10x each side   Clamshells 15x each LE   Glute bridge 15x. Rates easy   SLR 15x each LE   Seated hamstring stretch 30 sec each LE   Reviewed and updated HEP    PATIENT EDUCATION:  Education details: further assessment findings, exercise technique, HEP Person educated: Patient Education method: Explanation, Demonstration, Tactile cues, Verbal cues, and Handouts Education comprehension: verbalized understanding and returned demonstration  HOME EXERCISE PROGRAM: Access Code: [email or web address] URL: [email or web address] Date: 12/09/2022 Prepared by: Ricard Dillon  Exercises - Seated Shoulder Flexion Towel Slide at Table Top  - 1 x daily - 7 x weekly - 2 sets - 10 reps - Seated Thoracic Lumbar Extension  - 1 x daily - 7 x weekly - 2 sets - 10 reps - Supine Figure 4 Piriformis Stretch  - 1 x daily - 7 x weekly - 2 sets - 2 reps - 30 seconds hold - Supine Lower Trunk Rotation  - 1 x daily - 7 x weekly - 3 sets - 10 reps  ASSESSMENT:  CLINICAL IMPRESSION: Further assessment completed, pt found to have impaired hamstring length bilat. HEP initiated today to promote pain-free mobility. HEP to be updated next 1-2 sessions to add in strengthening interventions.   The pt will benefit from further skilled PT to address these deficits in order to increase functional mobility, decrease pain and improve QOL.   OBJECTIVE IMPAIRMENTS: decreased activity tolerance, decreased mobility, difficulty walking, decreased ROM, hypomobility, impaired sensation, postural dysfunction, and pain.   ACTIVITY LIMITATIONS: bending, bed mobility, locomotion level, and general affect on ADLs with increased pain  PARTICIPATION LIMITATIONS: cleaning, laundry, shopping, community activity, occupation, and yard work  PERSONAL FACTORS: Sex, Time since onset of injury/illness/exacerbation, and 1-2 comorbidities: PMH significant for the following: elevated BP reading, hx of multiple miscarriages, abscess of R breast, macromastia, symptomatic mammary hypertrophy, back pain, neck pain, hx of appendectomy.  are also affecting patient's functional outcome.   REHAB POTENTIAL: Fair    CLINICAL DECISION MAKING: Evolving/moderate complexity  EVALUATION COMPLEXITY: Moderate   GOALS: Goals reviewed with patient? Yes  SHORT TERM GOALS: Target date: 12/27/2022    Patient will be independent in home exercise program to improve strength/mobility for better functional independence with ADLs. Baseline: to be initiated next 1-2 visits  Goal status: INITIAL   LONG TERM GOALS: Target date: 01/17/2023   Patient will increase FOTO score to equal to or greater than  71  to demonstrate statistically significant improvement in mobility and quality of life.  Baseline: 58 Goal status: INITIAL  2.   Patient will report a worst pain of 3/10 on VAS in             to improve tolerance with ADLs and reduced symptoms with activities.  Baseline: pt presents with 6/10 pain following taking pain medication Goal status: INITIAL  3.  Patient will reduce modified Oswestry score to <18 as to demonstrate minimal disability with ADLs including improved sleeping tolerance, walking/sitting tolerance etc for  better mobility with ADLs.  Baseline: 19% Goal status: INITIAL  4.  Patient will reduce Neck Disability Index score to <10% to demonstrate minimal disability with ADL's including improved sleeping tolerance, sitting tolerance, etc for better mobility at home and work. Baseline: 26% Goal status: INITIAL    PLAN:  PT FREQUENCY: 2x/week  PT DURATION: 6 weeks  PLANNED INTERVENTIONS: Therapeutic exercises, Therapeutic activity, Neuromuscular re-education, Balance training, Gait training, Patient/Family education, Self Care, Joint mobilization, Joint manipulation, Stair training, Vestibular training, Canalith repositioning, Orthotic/Fit training, DME instructions, Dry Needling, Electrical stimulation, Spinal mobilization, Cryotherapy, Moist heat, scar mobilization, Splintting, Taping, Traction, and Manual therapy.  PLAN FOR NEXT SESSION: update HEP for strengthening, strengthening, stretching, manual as indicated   Zollie Pee, PT 12/09/2022, 5:39 PM

## 2022-12-13 ENCOUNTER — Ambulatory Visit: Payer: 59

## 2022-12-13 DIAGNOSIS — M542 Cervicalgia: Secondary | ICD-10-CM

## 2022-12-13 DIAGNOSIS — M5459 Other low back pain: Secondary | ICD-10-CM | POA: Diagnosis not present

## 2022-12-13 DIAGNOSIS — M6281 Muscle weakness (generalized): Secondary | ICD-10-CM

## 2022-12-13 NOTE — Therapy (Signed)
OUTPATIENT PHYSICAL THERAPY THORACOLUMBAR TREATMENT NOTE   Patient Name: Julie Vaughn MRN: [id number] DOB:05-14-1986, 37 y.o., female Today's Date: 12/13/2022  END OF SESSION:  PT End of Session - 12/13/22 1312     Visit Number 3    Number of Visits 13    Date for PT Re-Evaluation 01/17/23    Authorization Type [employer]-    Authorization Time Period 12/06/22-01/17/23    Progress Note Due on Visit 10    PT Start Time 1305    PT Stop Time 1343    PT Time Calculation (min) 38 min    Activity Tolerance Patient tolerated treatment well    Behavior During Therapy [facility] for tasks assessed/performed             Past Medical History:  Diagnosis Date   History of multiple miscarriages    Obesity    Past Surgical History:  Procedure Laterality Date   APPENDECTOMY     INTRAUTERINE DEVICE (IUD) INSERTION     Patient Active Problem List   Diagnosis Date Noted   Symptomatic mammary hypertrophy 11/30/2022   Back pain 11/30/2022   Neck pain 11/30/2022   Gas pain 10/15/2022   Loose stools 10/15/2022   Right sided abdominal pain 10/15/2022   Abscess of right breast 10/11/2022   Macromastia 10/11/2022   Acute upper abdominal pain 04/02/2022   Elevated blood pressure reading 03/15/2019   Class 1 obesity due to excess calories with body mass index (BMI) of 30.0 to 30.9 in adult 03/15/2019   IUD (intrauterine device) in place 03/15/2019   History of multiple miscarriages     PCP: Silverio Decamp, MD   REFERRING PROVIDER: Wallace Going, DO   REFERRING DIAG:  N61.1 (ICD-10-CM) - Abscess of right breast  N62 (ICD-10-CM) - Symptomatic mammary hypertrophy  M54.6,G89.29 (ICD-10-CM) - Chronic bilateral thoracic back pain  M54.2 (ICD-10-CM) - Neck pain    Rationale for Evaluation and Treatment: Rehabilitation  THERAPY DIAG:  Other low back pain  Cervicalgia  Muscle weakness (generalized)  ONSET DATE: 17 years ago  SUBJECTIVE:                                                                                                                                                                                            SUBJECTIVE STATEMENT:  Pt doing well, no pain today.   PERTINENT HISTORY:   Pt is a pleasant 37 yo female referred to PT with dx of mammary hypertrophy, neck pain, and back pain. Pt reports pain started 17 years ago. She reports she experiences back pain throughout entire day, affecting her work  day and her ability to sleep through the night. She currently manages her pain with ibuprofen. Pt also with hx of recurrent abscesses between her breasts. She has been told not to wear bras with underwire as a result, but now must wear multiple sports brass to improve support/comfort. Her low back pain is currently 6/10. Pt also with chronic neck pain with occ R UE numbness. She has experienced RUE weakness in the past associated with her pain. She reports no LE weakness or issues with balance. Pt was attending crossfit at one time and reports this reduced her pain some but did not resolve it. Pt it trialing PT prior to breast reduction surgery. 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 Per chart, PMH significant for the following: elevated BP reading, hx of multiple miscarriages, abscess of R breast, macromastia, symptomatic mammary hypertrophy, back pain, neck pain, hx of appendectomy.  PAIN:  Are you having pain? Yes: NPRS scale: low back 0/10 Pain location: neck, low back and upper back Pain description: aching, sharp "it wakes me up" Aggravating factors: activity Relieving factors: ibuprofen  PRECAUTIONS: Pt with hx of abscesses between breasts.   WEIGHT BEARING RESTRICTIONS: No  FALLS:  Has patient fallen in last 6 months? 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  NEXT MD VISIT: Pt has a follow-up appointment with her PA in March.  OBJECTIVE:    TODAY'S TREATMENT:                                                                                                                              DATE:    -Hooklying on thoracic towel roll x10 minutes -concurrent wand flexion x15 c red phys ball -single leg marching 15x3secH bilat -double leg flexion 15x3secH  -cable weight bar resting stance to 'clean' position 20x3secH  -bilat standing marching 1x20 each c 3lb AW  -back squat 12lb to chair height 1x10 -back squat to 12" box no load 1x10 -backsquat to 12"  box + airex 12lb 1x10    PATIENT EDUCATION:  Education details: further assessment findings, exercise technique, HEP Person educated: Patient Education method: Explanation, Demonstration, Tactile cues, Verbal cues, and Handouts Education comprehension: verbalized understanding and returned demonstration  HOME EXERCISE PROGRAM: Access Code: [email or web address] URL: [email or web address] Date: 12/09/2022 Prepared by: Ricard Dillon  Exercises - Seated Shoulder Flexion Towel Slide at Table Top  - 1 x daily - 7 x weekly - 2 sets - 10 reps - Seated Thoracic Lumbar Extension  - 1 x daily - 7 x weekly - 2 sets - 10 reps - Supine Figure 4 Piriformis Stretch  - 1 x daily - 7 x weekly - 2 sets - 2 reps - 30 seconds hold -  Supine Lower Trunk Rotation  - 1 x daily - 7 x weekly - 3 sets - 10 reps  ASSESSMENT:  CLINICAL IMPRESSION: Continued with core strengthing and postural mobility work. Integrated exercises previously used in crossfit last year. Good tolerance overall. The pt will benefit from further skilled PT to address these deficits in order to increase functional mobility, decrease pain and improve QOL.   OBJECTIVE IMPAIRMENTS: decreased activity tolerance, decreased mobility, difficulty walking, decreased  ROM, hypomobility, impaired sensation, postural dysfunction, and pain.   ACTIVITY LIMITATIONS: bending, bed mobility, locomotion level, and general affect on ADLs with increased pain  PARTICIPATION LIMITATIONS: cleaning, laundry, shopping, community activity, occupation, and yard work  PERSONAL FACTORS: Sex, Time since onset of injury/illness/exacerbation, and 1-2 comorbidities: PMH significant for the following: elevated BP reading, hx of multiple miscarriages, abscess of R breast, macromastia, symptomatic mammary hypertrophy, back pain, neck pain, hx of appendectomy.  are also affecting patient's functional outcome.   REHAB POTENTIAL: Fair    CLINICAL DECISION MAKING: Evolving/moderate complexity  EVALUATION COMPLEXITY: Moderate   GOALS: Goals reviewed with patient? Yes  SHORT TERM GOALS: Target date: 12/27/2022    Patient will be independent in home exercise program to improve strength/mobility for better functional independence with ADLs. Baseline: to be initiated next 1-2 visits  Goal status: INITIAL   LONG TERM GOALS: Target date: 01/17/2023   Patient will increase FOTO score to equal to or greater than  71  to demonstrate statistically significant improvement in mobility and quality of life.  Baseline: 58 Goal status: INITIAL  2.   Patient will report a worst pain of 3/10 on VAS in             to improve tolerance with ADLs and reduced symptoms with activities.  Baseline: pt presents with 6/10 pain following taking pain medication Goal status: INITIAL  3.    Patient will reduce modified Oswestry score to <18 as to demonstrate minimal disability with ADLs including improved sleeping tolerance, walking/sitting tolerance etc for better mobility with ADLs.  Baseline: 19% Goal status: INITIAL  4.  Patient will reduce Neck Disability Index score to <10% to demonstrate minimal disability with ADL's including improved sleeping tolerance, sitting tolerance, etc for better  mobility at home and work. Baseline: 26% Goal status: INITIAL    PLAN:  PT FREQUENCY: 2x/week  PT DURATION: 6 weeks  PLANNED INTERVENTIONS: Therapeutic exercises, Therapeutic activity, Neuromuscular re-education, Balance training, Gait training, Patient/Family education, Self Care, Joint mobilization, Joint manipulation, Stair training, Vestibular training, Canalith repositioning, Orthotic/Fit training, DME instructions, Dry Needling, Electrical stimulation, Spinal mobilization, Cryotherapy, Moist heat, scar mobilization, Splintting, Taping, Traction, and Manual therapy.  PLAN FOR NEXT SESSION: update HEP for strengthening, strengthening, stretching, manual as indicated   Lillyan Hitson C, PT 12/13/2022, 1:21 PM  1:21 PM, 12/13/22 Etta Grandchild, PT, DPT Physical Therapist - [facility]  [phone] [facility])

## 2022-12-16 ENCOUNTER — Ambulatory Visit: Payer: 59

## 2022-12-20 ENCOUNTER — Ambulatory Visit: Payer: 59 | Admitting: Physical Therapy

## 2022-12-20 DIAGNOSIS — M5459 Other low back pain: Secondary | ICD-10-CM | POA: Diagnosis not present

## 2022-12-20 DIAGNOSIS — M542 Cervicalgia: Secondary | ICD-10-CM

## 2022-12-20 DIAGNOSIS — M6281 Muscle weakness (generalized): Secondary | ICD-10-CM | POA: Diagnosis not present

## 2022-12-20 NOTE — Therapy (Signed)
OUTPATIENT PHYSICAL THERAPY THORACOLUMBAR TREATMENT NOTE   Patient Name: Julie Vaughn MRN: [id number] DOB:06-Feb-1986, 37 y.o., female Today's Date: 12/20/2022  END OF SESSION:    Past Medical History:  Diagnosis Date   History of multiple miscarriages    Obesity    Past Surgical History:  Procedure Laterality Date   APPENDECTOMY     INTRAUTERINE DEVICE (IUD) INSERTION     Patient Active Problem List   Diagnosis Date Noted   Symptomatic mammary hypertrophy 11/30/2022   Back pain 11/30/2022   Neck pain 11/30/2022   Gas pain 10/15/2022   Loose stools 10/15/2022   Right sided abdominal pain 10/15/2022   Abscess of right breast 10/11/2022   Macromastia 10/11/2022   Acute upper abdominal pain 04/02/2022   Elevated blood pressure reading 03/15/2019   Class 1 obesity due to excess calories with body mass index (BMI) of 30.0 to 30.9 in adult 03/15/2019   IUD (intrauterine device) in place 03/15/2019   History of multiple miscarriages     PCP: Silverio Decamp, MD   REFERRING PROVIDER: Wallace Going, DO   REFERRING DIAG:  N61.1 (ICD-10-CM) - Abscess of right breast  N62 (ICD-10-CM) - Symptomatic mammary hypertrophy  M54.6,G89.29 (ICD-10-CM) - Chronic bilateral thoracic back pain  M54.2 (ICD-10-CM) - Neck pain    Rationale for Evaluation and Treatment: Rehabilitation  THERAPY DIAG:  No diagnosis found.  ONSET DATE: 17 years ago  SUBJECTIVE:                                                                                                                                                                                           SUBJECTIVE STATEMENT:  Pt reports significant pain following her last PT session. She reports she feels she has regressed and is now taking ibuprofen for pain relief.  She states she would not like to continue with these exercises as they caused her significant pain.   PERTINENT HISTORY:   Pt is a pleasant 37 yo female  referred to PT with dx of mammary hypertrophy, neck pain, and back pain. Pt reports pain started 17 years ago. She reports she experiences back pain throughout entire day, affecting her work day and her ability to sleep through the night. She currently manages her pain with ibuprofen. Pt also with hx of recurrent abscesses between her breasts. She has been told not to wear bras with underwire as a result, but now must wear multiple sports brass to improve support/comfort. Her low back pain is currently 6/10. Pt also with chronic neck pain with occ R UE numbness. She has experienced RUE weakness in the past associated  with her pain. She reports no LE weakness or issues with balance. Pt was attending crossfit at one time and reports this reduced her pain some but did not resolve it. Pt it trialing PT prior to breast reduction surgery. 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 Per chart, PMH significant for the following: elevated BP reading, hx of multiple miscarriages, abscess of R breast, macromastia, symptomatic mammary hypertrophy, back pain, neck pain, hx of appendectomy.  PAIN:  Are you having pain? Yes: NPRS scale: low back 7/10, mid back6/10 and shoulders 4//10 Pain location: neck, low back and upper back Pain description: aching, sharp "it wakes me up" Aggravating factors: activity Relieving factors: ibuprofen  PRECAUTIONS: Pt with hx of abscesses between breasts.   WEIGHT BEARING RESTRICTIONS: No  FALLS:  Has patient fallen in last 6 months? 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  NEXT MD VISIT: Pt has a follow-up appointment with her PA in March.  OBJECTIVE:    TODAY'S TREATMENT:                                                                                                                               DATE: 12/20/22  Seated stability ball rollouts FWD/BCKWD/LTL x 2 min for each   On plinth:  LTRs x 3 min bilaterally. Reports feels good  Open book - 10x each side   Clamshells 15x each LE   Glute bridge with GTB around knees to target gluteal musculature 2x10.   Manual:   Piriformis stretch, knee to chest stretch and hamstring stretch x 45 sec ea bilaterally   IASTM to posterior gluteal musculature x 3 min ea side for   PATIENT EDUCATION:  Education details: further assessment findings, exercise technique, HEP Person educated: Patient Education method: Explanation, Demonstration, Tactile cues, Verbal cues, and Handouts Education comprehension: verbalized understanding and returned demonstration  HOME EXERCISE PROGRAM: Access Code: [email or web address] URL: [email or web address] Date: 12/09/2022 Prepared by: Ricard Dillon  Exercises - Seated Shoulder Flexion Towel Slide at Table Top  - 1 x daily - 7 x weekly - 2 sets - 10 reps - Seated Thoracic Lumbar Extension  - 1 x daily - 7 x weekly - 2 sets - 10 reps - Supine Figure 4 Piriformis Stretch  - 1 x daily - 7 x weekly - 2 sets - 2 reps - 30 seconds hold - Supine Lower Trunk Rotation  - 1 x daily - 7 x weekly - 3 sets - 10 reps  ASSESSMENT:  CLINICAL IMPRESSION: Pt presents to PT with significantly increased pain compared to last session. She reports in the days following her previous treatment session she had pain that has lingered over the weekend and has recently began  to resolve. We avoided the exercises performed last session ( with floater PT) and went to therapeutic exercises that had been beneficial int he past. Pt responded well with no increase in pain during session and she was encouraged to report her symptoms following this session. Pt will continue to benefit from skilled physical therapy intervention to address impairments, improve QOL, and attain  therapy goals.    OBJECTIVE IMPAIRMENTS: decreased activity tolerance, decreased mobility, difficulty walking, decreased ROM, hypomobility, impaired sensation, postural dysfunction, and pain.   ACTIVITY LIMITATIONS: bending, bed mobility, locomotion level, and general affect on ADLs with increased pain  PARTICIPATION LIMITATIONS: cleaning, laundry, shopping, community activity, occupation, and yard work  PERSONAL FACTORS: Sex, Time since onset of injury/illness/exacerbation, and 1-2 comorbidities: PMH significant for the following: elevated BP reading, hx of multiple miscarriages, abscess of R breast, macromastia, symptomatic mammary hypertrophy, back pain, neck pain, hx of appendectomy.  are also affecting patient's functional outcome.   REHAB POTENTIAL: Fair    CLINICAL DECISION MAKING: Evolving/moderate complexity  EVALUATION COMPLEXITY: Moderate   GOALS: Goals reviewed with patient? Yes  SHORT TERM GOALS: Target date: 12/27/2022    Patient will be independent in home exercise program to improve strength/mobility for better functional independence with ADLs. Baseline: to be initiated next 1-2 visits  Goal status: INITIAL   LONG TERM GOALS: Target date: 01/17/2023   Patient will increase FOTO score to equal to or greater than  71  to demonstrate statistically significant improvement in mobility and quality of life.  Baseline: 58 Goal status: INITIAL  2.   Patient will report a worst pain of 3/10 on VAS in             to improve tolerance with ADLs and reduced symptoms with activities.  Baseline: pt presents with 6/10 pain following taking pain medication Goal status: INITIAL  3.    Patient will reduce modified Oswestry score to <18 as to demonstrate minimal disability with ADLs including improved sleeping tolerance, walking/sitting tolerance etc for better mobility with ADLs.  Baseline: 19% Goal status: INITIAL  4.  Patient will reduce Neck Disability Index score to  <10% to demonstrate minimal disability with ADL's including improved sleeping tolerance, sitting tolerance, etc for better mobility at home and work. Baseline: 26% Goal status: INITIAL    PLAN:  PT FREQUENCY: 2x/week  PT DURATION: 6 weeks  PLANNED INTERVENTIONS: Therapeutic exercises, Therapeutic activity, Neuromuscular re-education, Balance training, Gait training, Patient/Family education, Self Care, Joint mobilization, Joint manipulation, Stair training, Vestibular training, Canalith repositioning, Orthotic/Fit training, DME instructions, Dry Needling, Electrical stimulation, Spinal mobilization, Cryotherapy, Moist heat, scar mobilization, Splintting, Taping, Traction, and Manual therapy.  PLAN FOR NEXT SESSION: update HEP for strengthening, strengthening, stretching, manual as indicated   Particia Lather, PT 12/20/2022, 8:38 AM  8:38 AM, 12/20/22

## 2022-12-22 ENCOUNTER — Ambulatory Visit: Payer: 59 | Admitting: Physical Therapy

## 2022-12-22 DIAGNOSIS — M5459 Other low back pain: Secondary | ICD-10-CM

## 2022-12-22 DIAGNOSIS — M542 Cervicalgia: Secondary | ICD-10-CM

## 2022-12-22 DIAGNOSIS — M6281 Muscle weakness (generalized): Secondary | ICD-10-CM | POA: Diagnosis not present

## 2022-12-22 NOTE — Therapy (Unsigned)
OUTPATIENT PHYSICAL THERAPY THORACOLUMBAR TREATMENT NOTE   Patient Name: Julie Vaughn MRN: [id number] DOB:1986-08-16, 37 y.o., female Today's Date: 12/23/2022  END OF SESSION:  PT End of Session - 12/22/22 1518     Visit Number 5    Number of Visits 13    Date for PT Re-Evaluation 01/17/23    Authorization Type [employer]-    Authorization Time Period 12/06/22-01/17/23    Progress Note Due on Visit 10    PT Start Time 1517    PT Stop Time 1558    PT Time Calculation (min) 41 min    Activity Tolerance Patient tolerated treatment well    Behavior During Therapy [facility] for tasks assessed/performed              Past Medical History:  Diagnosis Date   History of multiple miscarriages    Obesity    Past Surgical History:  Procedure Laterality Date   APPENDECTOMY     INTRAUTERINE DEVICE (IUD) INSERTION     Patient Active Problem List   Diagnosis Date Noted   Symptomatic mammary hypertrophy 11/30/2022   Back pain 11/30/2022   Neck pain 11/30/2022   Gas pain 10/15/2022   Loose stools 10/15/2022   Right sided abdominal pain 10/15/2022   Abscess of right breast 10/11/2022   Macromastia 10/11/2022   Acute upper abdominal pain 04/02/2022   Elevated blood pressure reading 03/15/2019   Class 1 obesity due to excess calories with body mass index (BMI) of 30.0 to 30.9 in adult 03/15/2019   IUD (intrauterine device) in place 03/15/2019   History of multiple miscarriages     PCP: Silverio Decamp, MD   REFERRING PROVIDER: Wallace Going, DO   REFERRING DIAG:  N61.1 (ICD-10-CM) - Abscess of right breast  N62 (ICD-10-CM) - Symptomatic mammary hypertrophy  M54.6,G89.29 (ICD-10-CM) - Chronic bilateral thoracic back pain  M54.2 (ICD-10-CM) - Neck pain    Rationale for Evaluation and Treatment: Rehabilitation  THERAPY DIAG:  Other low back pain  Cervicalgia  Muscle weakness (generalized)  ONSET DATE: 17 years ago  SUBJECTIVE:                                                                                                                                                                                            SUBJECTIVE STATEMENT:  Pt reports some improvement in pain since last session. Back to her "baseline"  PERTINENT HISTORY:   Pt is a pleasant 37 yo female referred to PT with dx of mammary hypertrophy, neck pain, and back pain. Pt reports pain started 17 years ago. She reports she experiences back  pain throughout entire day, affecting her work day and her ability to sleep through the night. She currently manages her pain with ibuprofen. Pt also with hx of recurrent abscesses between her breasts. She has been told not to wear bras with underwire as a result, but now must wear multiple sports brass to improve support/comfort. Her low back pain is currently 6/10. Pt also with chronic neck pain with occ R UE numbness. She has experienced RUE weakness in the past associated with her pain. She reports no LE weakness or issues with balance. Pt was attending crossfit at one time and reports this reduced her pain some but did not resolve it. Pt it trialing PT prior to breast reduction surgery. 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 Per chart, PMH significant for the following: elevated BP reading, hx of multiple miscarriages, abscess of R breast, macromastia, symptomatic mammary hypertrophy, back pain, neck pain, hx of appendectomy.  PAIN:  Are you having pain? Yes: NPRS scale: low back 4/10, mid back3/10 and shoulders 3/10 Pain location: neck, low back and upper back Pain description: aching, sharp "it wakes me up" Aggravating factors: activity Relieving factors: ibuprofen  PRECAUTIONS: Pt with hx of abscesses between breasts.   WEIGHT BEARING RESTRICTIONS: No  FALLS:  Has patient fallen in last 6 months? 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  NEXT MD VISIT: Pt has a follow-up appointment with her PA in March.  OBJECTIVE:    TODAY'S TREATMENT:                                                                                                                              DATE: 12/23/22  Seated stability ball rollouts FWD/BCKWD/LTL x 2 min for each   On plinth:  LTRs x 10 x 5 sec ea LE   Open book - 10x each side   Clamshells 2x10 each LE with GTB   Glute bridge with GTB around knees to target gluteal musculature 2x10   Seated thoracic ext over chair (bolster placed behind pt) x20    Cat camel stretch x 5 ea   Matrix cable machine rows 2 x 10 with 12.5# bilat UE  Piriformis stretch, knee to chest stretch, figure 4 stretch, and hamstring stretch x 45 sec ea bilaterally    PATIENT EDUCATION:  Education details: further assessment findings, exercise technique, HEP Person educated: Patient Education method: Explanation, Demonstration, Tactile cues, Verbal cues, and Handouts Education comprehension: verbalized understanding and returned demonstration  HOME EXERCISE PROGRAM: Access Code: [email or web address] URL: [email or web address] Date: 12/09/2022 Prepared by: Ricard Dillon  Exercises - Seated Shoulder Flexion Towel Slide at Table Top  - 1 x daily - 7 x weekly - 2 sets -  10 reps - Seated Thoracic Lumbar Extension  - 1 x daily - 7 x weekly - 2 sets - 10 reps - Supine Figure 4 Piriformis Stretch  - 1 x daily - 7 x weekly - 2 sets - 2 reps - 30 seconds hold - Supine Lower Trunk Rotation  - 1 x daily - 7 x weekly - 3 sets - 10 reps  ASSESSMENT:  CLINICAL IMPRESSION: Continued with current plan of care as laid out in evaluation and recent prior sessions. Pt notes significant pain increase from a few sessions ago following squat activity has returned  to baseline level of pain. Pt reports no adverse effects from last session and had some relief of pain in the past few days.  Pt closely monitored throughout session pt response and to maximize patient safety during interventions. Pt continues to demonstrate progress toward goals AEB progression of interventions this date either in volume or intensity.   OBJECTIVE IMPAIRMENTS: decreased activity tolerance, decreased mobility, difficulty walking, decreased ROM, hypomobility, impaired sensation, postural dysfunction, and pain.   ACTIVITY LIMITATIONS: bending, bed mobility, locomotion level, and general affect on ADLs with increased pain  PARTICIPATION LIMITATIONS: cleaning, laundry, shopping, community activity, occupation, and yard work  PERSONAL FACTORS: Sex, Time since onset of injury/illness/exacerbation, and 1-2 comorbidities: PMH significant for the following: elevated BP reading, hx of multiple miscarriages, abscess of R breast, macromastia, symptomatic mammary hypertrophy, back pain, neck pain, hx of appendectomy.  are also affecting patient's functional outcome.   REHAB POTENTIAL: Fair    CLINICAL DECISION MAKING: Evolving/moderate complexity  EVALUATION COMPLEXITY: Moderate   GOALS: Goals reviewed with patient? Yes  SHORT TERM GOALS: Target date: 12/27/2022    Patient will be independent in home exercise program to improve strength/mobility for better functional independence with ADLs. Baseline: to be initiated next 1-2 visits  Goal status: INITIAL   LONG TERM GOALS: Target date: 01/17/2023   Patient will increase FOTO score to equal to or greater than  71  to demonstrate statistically significant improvement in mobility and quality of life.  Baseline: 58 Goal status: INITIAL  2.   Patient will report a worst pain of 3/10 on VAS in             to improve tolerance with ADLs and reduced symptoms with activities.  Baseline: pt presents with 6/10 pain following taking pain  medication Goal status: INITIAL  3.    Patient will reduce modified Oswestry score to <18 as to demonstrate minimal disability with ADLs including improved sleeping tolerance, walking/sitting tolerance etc for better mobility with ADLs.  Baseline: 19% Goal status: INITIAL  4.  Patient will reduce Neck Disability Index score to <10% to demonstrate minimal disability with ADL's including improved sleeping tolerance, sitting tolerance, etc for better mobility at home and work. Baseline: 26% Goal status: INITIAL    PLAN:  PT FREQUENCY: 2x/week  PT DURATION: 6 weeks  PLANNED INTERVENTIONS: Therapeutic exercises, Therapeutic activity, Neuromuscular re-education, Balance training, Gait training, Patient/Family education, Self Care, Joint mobilization, Joint manipulation, Stair training, Vestibular training, Canalith repositioning, Orthotic/Fit training, DME instructions, Dry Needling, Electrical stimulation, Spinal mobilization, Cryotherapy, Moist heat, scar mobilization, Splintting, Taping, Traction, and Manual therapy.  PLAN FOR NEXT SESSION: update HEP for strengthening, strengthening, stretching, manual as indicated   Particia Lather, PT 12/23/2022, 7:59 AM  7:59 AM, 12/23/22

## 2022-12-23 ENCOUNTER — Encounter: Payer: Self-pay | Admitting: Physical Therapy

## 2022-12-23 ENCOUNTER — Ambulatory Visit: Payer: 59

## 2022-12-28 NOTE — Therapy (Signed)
OUTPATIENT PHYSICAL THERAPY THORACOLUMBAR TREATMENT NOTE   Patient Name: Julie Vaughn MRN: [id number] DOB:[birth year], 37 y.o., female Today's Date: 12/29/2022  END OF SESSION:  PT End of Session - 12/29/22 1520     Visit Number 6    Number of Visits 13    Date for PT Re-Evaluation 01/17/23    Authorization Type [employer]-    Authorization Time Period 12/06/22-01/17/23    Progress Note Due on Visit 10    PT Start Time 1518    PT Stop Time 1559    PT Time Calculation (min) 41 min    Activity Tolerance Patient tolerated treatment well    Behavior During Therapy [facility] for tasks assessed/performed               Past Medical History:  Diagnosis Date   History of multiple miscarriages    Obesity    Past Surgical History:  Procedure Laterality Date   APPENDECTOMY     INTRAUTERINE DEVICE (IUD) INSERTION     Patient Active Problem List   Diagnosis Date Noted   Symptomatic mammary hypertrophy 11/30/2022   Back pain 11/30/2022   Neck pain 11/30/2022   Gas pain 10/15/2022   Loose stools 10/15/2022   Right sided abdominal pain 10/15/2022   Abscess of right breast 10/11/2022   Macromastia 10/11/2022   Acute upper abdominal pain 04/02/2022   Elevated blood pressure reading 03/15/2019   Class 1 obesity due to excess calories with body mass index (BMI) of 30.0 to 30.9 in adult 03/15/2019   IUD (intrauterine device) in place 03/15/2019   History of multiple miscarriages     PCP: Silverio Decamp, MD   REFERRING PROVIDER: Wallace Going, DO   REFERRING DIAG:  N61.1 (ICD-10-CM) - Abscess of right breast  N62 (ICD-10-CM) - Symptomatic mammary hypertrophy  M54.6,G89.29 (ICD-10-CM) - Chronic bilateral thoracic back pain  M54.2 (ICD-10-CM) - Neck pain    Rationale for Evaluation and Treatment: Rehabilitation  THERAPY DIAG:  Other low back pain  Cervicalgia  Muscle weakness (generalized)  ONSET DATE: 17 years ago  SUBJECTIVE:                                                                                                                                                                                            SUBJECTIVE STATEMENT:  Pt reports some improvement in pain since last session. Back to her "baseline".  Patient reports some soreness after last visit but nothing out of the ordinary side of general muscle soreness lasted about a day.  Patient does report she would like to get back to gym-based exercise  and mentioned joining [employer]. \  PERTINENT HISTORY:   Pt is a pleasant 37 yo female referred to PT with dx of mammary hypertrophy, neck pain, and back pain. Pt reports pain started 17 years ago. She reports she experiences back pain throughout entire day, affecting her work day and her ability to sleep through the night. She currently manages her pain with ibuprofen. Pt also with hx of recurrent abscesses between her breasts. She has been told not to wear bras with underwire as a result, but now must wear multiple sports brass to improve support/comfort. Her low back pain is currently 6/10. Pt also with chronic neck pain with occ R UE numbness. She has experienced RUE weakness in the past associated with her pain. She reports no LE weakness or issues with balance. Pt was attending crossfit at one time and reports this reduced her pain some but did not resolve it. Pt it trialing PT prior to breast reduction surgery. 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 Per chart, PMH significant for the following: elevated BP reading, hx of multiple miscarriages, abscess of R breast, macromastia, symptomatic mammary hypertrophy, back pain, neck pain, hx of appendectomy.  PAIN:  Are you having pain? Yes: NPRS scale: low back 4/10, mid back2/10 and shoulders 2/10 Pain location: neck, low back and upper back Pain description: aching, sharp "it wakes me up" Aggravating factors: activity Relieving factors: ibuprofen  PRECAUTIONS: Pt  with hx of abscesses between breasts.   WEIGHT BEARING RESTRICTIONS: No  FALLS:  Has patient fallen in last 6 months? 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  NEXT MD VISIT: Pt has a follow-up appointment with her PA in March.  OBJECTIVE:    TODAY'S TREATMENT:                                                                                                                              DATE: 12/29/22  Seated stability ball rollouts FWD/BCKWD/LTL X 10 ea direction with 5 second holds   On plinth:  LTRs x 10 x 5 sec ea LE   Open book - 10x each side   Piriformis stretch, knee to chest stretch, figure 4 stretch, and hamstring stretch x 45 sec ea bilaterally   Clamshells 2x12 each LE with GTB   Glute bridge with GTB around knees to target gluteal musculature 2x12   Cat camel stretch x 10 ea    Shoulder extensions with RTB with PT holding band GTB 2 x 10   Shoulder horizontal abduction with GTB x 10 reps   Matrix cable machine rows x 10 with 12.5# bilat UE - 18.5#   PATIENT EDUCATION:  Education details: further assessment findings, exercise technique, HEP  Person educated: Patient Education method: Explanation, Demonstration, Tactile cues, Verbal cues, and Handouts Education comprehension: verbalized understanding and returned demonstration  HOME EXERCISE PROGRAM: Access Code: [email or web address] URL: [email or web address] Date: 12/29/2022 (updated)  Prepared by: Rivka Barbara  Exercises - Seated Shoulder Flexion Towel Slide at Table Top  - 1 x daily - 7 x weekly - 2 sets - 10 reps - Supine Figure 4 Piriformis Stretch  - 1 x daily - 7 x weekly - 2 sets - 2 reps - 30 seconds hold - Supine Lower Trunk Rotation  - 1 x daily - 7 x  weekly - 10 reps - Clamshell with Resistance  - 1 x daily - 7 x weekly - 2 sets - 12 reps - Bridge with Hip Abduction and Resistance  - 1 x daily - 7 x weekly - 2 sets - 12 reps - Cat Cow  - 1 x daily - 7 x weekly - 1 sets - 10 reps - 5 sec  hold  ASSESSMENT:  CLINICAL IMPRESSION: Continued with current plan of care as laid out in evaluation and recent prior sessions. Pt reports continued improvement in pain but still has pain at low levels despite continued ibuprofen treatment.  Patient provided with updated home exercise program as well as with therapy and need to complete these.  Pt closely monitored throughout session pt response and to maximize patient safety during interventions. Pt continues to demonstrate progress toward goals AEB progression of interventions this date either in volume or intensity.   OBJECTIVE IMPAIRMENTS: decreased activity tolerance, decreased mobility, difficulty walking, decreased ROM, hypomobility, impaired sensation, postural dysfunction, and pain.   ACTIVITY LIMITATIONS: bending, bed mobility, locomotion level, and general affect on ADLs with increased pain  PARTICIPATION LIMITATIONS: cleaning, laundry, shopping, community activity, occupation, and yard work  PERSONAL FACTORS: Sex, Time since onset of injury/illness/exacerbation, and 1-2 comorbidities: PMH significant for the following: elevated BP reading, hx of multiple miscarriages, abscess of R breast, macromastia, symptomatic mammary hypertrophy, back pain, neck pain, hx of appendectomy.  are also affecting patient's functional outcome.   REHAB POTENTIAL: Fair    CLINICAL DECISION MAKING: Evolving/moderate complexity  EVALUATION COMPLEXITY: Moderate   GOALS: Goals reviewed with patient? Yes  SHORT TERM GOALS: Target date: 12/27/2022    Patient will be independent in home exercise program to improve strength/mobility for better functional independence with ADLs. Baseline: to be initiated next 1-2  visits  Goal status: INITIAL   LONG TERM GOALS: Target date: 01/17/2023   Patient will increase FOTO score to equal to or greater than  71  to demonstrate statistically significant improvement in mobility and quality of life.  Baseline: 58 Goal status: INITIAL  2.   Patient will report a worst pain of 3/10 on VAS in             to improve tolerance with ADLs and reduced symptoms with activities.  Baseline: pt presents with 6/10 pain following taking pain medication Goal status: INITIAL  3.    Patient will reduce modified Oswestry score to <18 as to demonstrate minimal disability with ADLs including improved sleeping tolerance, walking/sitting tolerance etc for better mobility with ADLs.  Baseline: 19% Goal status: INITIAL  4.  Patient will reduce Neck Disability Index score to <10% to demonstrate minimal disability with ADL's including improved sleeping tolerance, sitting tolerance, etc for better mobility at home and work. Baseline: 26% Goal status: INITIAL    PLAN:  PT FREQUENCY: 2x/week  PT DURATION: 6 weeks  PLANNED INTERVENTIONS:  Therapeutic exercises, Therapeutic activity, Neuromuscular re-education, Balance training, Gait training, Patient/Family education, Self Care, Joint mobilization, Joint manipulation, Stair training, Vestibular training, Canalith repositioning, Orthotic/Fit training, DME instructions, Dry Needling, Electrical stimulation, Spinal mobilization, Cryotherapy, Moist heat, scar mobilization, Splintting, Taping, Traction, and Manual therapy.  PLAN FOR NEXT SESSION: update HEP for strengthening, strengthening, stretching, manual as indicated.    Particia Lather, PT 12/29/2022, 5:03 PM  5:03 PM, 12/29/22

## 2022-12-29 ENCOUNTER — Encounter: Payer: Self-pay | Admitting: Physical Therapy

## 2022-12-29 ENCOUNTER — Ambulatory Visit: Payer: 59 | Admitting: Physical Therapy

## 2022-12-29 DIAGNOSIS — M5459 Other low back pain: Secondary | ICD-10-CM | POA: Diagnosis not present

## 2022-12-29 DIAGNOSIS — M542 Cervicalgia: Secondary | ICD-10-CM

## 2022-12-29 DIAGNOSIS — M6281 Muscle weakness (generalized): Secondary | ICD-10-CM

## 2023-01-05 NOTE — Therapy (Signed)
OUTPATIENT PHYSICAL THERAPY THORACOLUMBAR TREATMENT NOTE   Patient Name: Julie Vaughn MRN: [id number] DOB:1986-03-12, 37 y.o., female Today's Date: 01/06/2023  END OF SESSION:  PT End of Session - 01/06/23 1555     Visit Number 7    Number of Visits 13    Date for PT Re-Evaluation 01/17/23    Authorization Type [employer]-    Authorization Time Period 12/06/22-01/17/23    Progress Note Due on Visit 10    PT Start Time 1600    PT Stop Time 1644    PT Time Calculation (min) 44 min    Activity Tolerance Patient tolerated treatment well    Behavior During Therapy [facility] for tasks assessed/performed                Past Medical History:  Diagnosis Date   History of multiple miscarriages    Obesity    Past Surgical History:  Procedure Laterality Date   APPENDECTOMY     INTRAUTERINE DEVICE (IUD) INSERTION     Patient Active Problem List   Diagnosis Date Noted   Symptomatic mammary hypertrophy 11/30/2022   Back pain 11/30/2022   Neck pain 11/30/2022   Gas pain 10/15/2022   Loose stools 10/15/2022   Right sided abdominal pain 10/15/2022   Abscess of right breast 10/11/2022   Macromastia 10/11/2022   Acute upper abdominal pain 04/02/2022   Elevated blood pressure reading 03/15/2019   Class 1 obesity due to excess calories with body mass index (BMI) of 30.0 to 30.9 in adult 03/15/2019   IUD (intrauterine device) in place 03/15/2019   History of multiple miscarriages     PCP: Silverio Decamp, MD   REFERRING PROVIDER: Wallace Going, DO   REFERRING DIAG:  N61.1 (ICD-10-CM) - Abscess of right breast  N62 (ICD-10-CM) - Symptomatic mammary hypertrophy  M54.6,G89.29 (ICD-10-CM) - Chronic bilateral thoracic back pain  M54.2 (ICD-10-CM) - Neck pain    Rationale for Evaluation and Treatment: Rehabilitation  THERAPY DIAG:  Other low back pain  Cervicalgia  Muscle weakness (generalized)  ONSET DATE: 17 years ago  SUBJECTIVE:                                                                                                                                                                                            SUBJECTIVE STATEMENT:  Patient went to surgical clinic yesterday, was told she has to lose 30 lb prior to surgery. Was given new meds.   PERTINENT HISTORY:   Pt is a pleasant 37 yo female referred to PT with dx of mammary hypertrophy, neck pain, and back pain. Pt  reports pain started 17 years ago. She reports she experiences back pain throughout entire day, affecting her work day and her ability to sleep through the night. She currently manages her pain with ibuprofen. Pt also with hx of recurrent abscesses between her breasts. She has been told not to wear bras with underwire as a result, but now must wear multiple sports brass to improve support/comfort. Her low back pain is currently 6/10. Pt also with chronic neck pain with occ R UE numbness. She has experienced RUE weakness in the past associated with her pain. She reports no LE weakness or issues with balance. Pt was attending crossfit at one time and reports this reduced her pain some but did not resolve it. Pt it trialing PT prior to breast reduction surgery. 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 Per chart, PMH significant for the following: elevated BP reading, hx of multiple miscarriages, abscess of R breast, macromastia, symptomatic mammary hypertrophy, back pain, neck pain, hx of appendectomy.  PAIN:  Are you having pain? Yes: NPRS scale: low back 4/10, mid back2/10 and shoulders 2/10 Pain location: neck, low back and upper back Pain description: aching, sharp "it wakes me up" Aggravating factors: activity Relieving factors: ibuprofen  PRECAUTIONS: Pt with hx of abscesses between breasts.   WEIGHT BEARING RESTRICTIONS: No  FALLS:  Has patient fallen in last 6 months? 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  NEXT MD VISIT: Pt has a follow-up appointment with her PA in March.  OBJECTIVE:    TODAY'S TREATMENT:                                                                                                                              DATE: 01/06/23  Supine: Open book stretch 5x each side TrA activation with swiss ball 10x 3 second holds TrA activation with swiss ball with UE raises ; 10x each UE TrA activation with swiss ball dead bugs 10x each side 90 90 holds 30 seconds x 3 trials Swiss ball hamstring curls 10x Bridge 10x Arnold to [employer] stretch 10x   Standing: Swiss ball wall squat 10x  Seated: Posterior pelvic tilt 10x Scapular retraction 10x  PATIENT EDUCATION:  Education details: further assessment findings, exercise technique, HEP Person educated: Patient Education method: Explanation, Demonstration, Tactile cues, Verbal cues, and Handouts Education comprehension: verbalized understanding and returned demonstration  HOME EXERCISE PROGRAM: Access Code: [email or web address] URL: [email or web address] Date: 12/29/2022 (updated)  Prepared by: Rivka Barbara  Exercises - Seated Shoulder Flexion Towel Slide at Table Top  - 1 x daily - 7 x weekly - 2 sets - 10 reps - Supine Figure 4 Piriformis Stretch  - 1 x daily - 7 x weekly -  2 sets - 2 reps - 30 seconds hold - Supine Lower Trunk Rotation  - 1 x daily - 7 x weekly - 10 reps - Clamshell with Resistance  - 1 x daily - 7 x weekly - 2 sets - 12 reps - Bridge with Hip Abduction and Resistance  - 1 x daily - 7 x weekly - 2 sets - 12 reps - Cat Cow  - 1 x daily - 7 x weekly - 1 sets - 10 reps - 5 sec  hold   Access Code: [email or web address] URL: [email or web address] Date: 01/06/2023 Prepared by: [place]  - 1 x daily - 7 x weekly - 2 sets - 10 reps - 5 hold - Dead Bug with Swiss Ball  - 1 x daily - 7 x weekly - 2 sets - 10 reps - 5 hold - Seated Abdominal Press into [employer]  - 1 x daily - 7 x weekly - 2 sets - 10 reps - 5 hold - Supine Hamstring Swiss Ball Curls/Dynamic Mobilization  - 1 x daily - 7 x weekly - 2 sets - 10 reps - 5 hold - Supine Lower Trunk Rotation  - 1 x daily - 7 x weekly - 2 sets - 10 reps - 5 hold - Supine Bridge  - 1 x daily - 7 x weekly - 2 sets - 10 reps - 5 hold - Supine 90/90 Abdominal Bracing  - 1 x daily - 7 x weekly - 2 sets - 3 reps - 30 hold - Clamshell with Resistance  - 1 x daily - 7 x weekly - 2 sets - 10 reps - 5 hold ASSESSMENT:  CLINICAL IMPRESSION: Patient presents with excellent motivation throughout physical therapy session. Patient is initially daunted by new weight loss program, educated on safe gym program routine set up, use of aquatics and dancing for exercise. Core stabilization program added to HEP with patient demonstrating understanding. Pt continues to demonstrate progress toward goals AEB progression of interventions this date either in volume or intensity.   OBJECTIVE IMPAIRMENTS: decreased activity tolerance, decreased mobility, difficulty walking, decreased ROM, hypomobility, impaired sensation, postural dysfunction, and pain.   ACTIVITY LIMITATIONS: bending, bed mobility, locomotion level, and general affect on ADLs with increased pain  PARTICIPATION LIMITATIONS: cleaning, laundry, shopping, community activity, occupation, and yard work  PERSONAL FACTORS: Sex, Time since onset of injury/illness/exacerbation, and 1-2 comorbidities: PMH significant for the following: elevated BP reading, hx of multiple miscarriages, abscess of R breast, macromastia, symptomatic mammary hypertrophy, back pain, neck pain, hx of appendectomy.  are also affecting patient's functional outcome.   REHAB POTENTIAL:  Fair    CLINICAL DECISION MAKING: Evolving/moderate complexity  EVALUATION COMPLEXITY: Moderate   GOALS: Goals reviewed with patient? Yes  SHORT TERM GOALS: Target date: 12/27/2022    Patient will be independent in home exercise program to improve strength/mobility for better functional independence with ADLs. Baseline: to be initiated next 1-2 visits  Goal status: INITIAL   LONG TERM GOALS: Target date: 01/17/2023   Patient will increase FOTO score to equal to or greater than  71  to demonstrate statistically significant improvement in mobility and quality of life.  Baseline: 58 Goal status: INITIAL  2.   Patient will report a worst pain of 3/10 on VAS in             to improve tolerance with ADLs and reduced symptoms with activities.  Baseline: pt presents with  6/10 pain following taking pain medication Goal status: INITIAL  3.    Patient will reduce modified Oswestry score to <18 as to demonstrate minimal disability with ADLs including improved sleeping tolerance, walking/sitting tolerance etc for better mobility with ADLs.  Baseline: 19% Goal status: INITIAL  4.  Patient will reduce Neck Disability Index score to <10% to demonstrate minimal disability with ADL's including improved sleeping tolerance, sitting tolerance, etc for better mobility at home and work. Baseline: 26% Goal status: INITIAL    PLAN:  PT FREQUENCY: 2x/week  PT DURATION: 6 weeks  PLANNED INTERVENTIONS: Therapeutic exercises, Therapeutic activity, Neuromuscular re-education, Balance training, Gait training, Patient/Family education, Self Care, Joint mobilization, Joint manipulation, Stair training, Vestibular training, Canalith repositioning, Orthotic/Fit training, DME instructions, Dry Needling, Electrical stimulation, Spinal mobilization, Cryotherapy, Moist heat, scar mobilization, Splintting, Taping, Traction, and Manual therapy.  PLAN FOR NEXT SESSION: update HEP for strengthening,  strengthening, stretching, manual as indicated.    Janna Arch, PT 01/06/2023, 4:50 PM  4:50 PM, 01/06/23

## 2023-01-06 ENCOUNTER — Ambulatory Visit: Payer: 59

## 2023-01-06 DIAGNOSIS — M6281 Muscle weakness (generalized): Secondary | ICD-10-CM

## 2023-01-06 DIAGNOSIS — M5459 Other low back pain: Secondary | ICD-10-CM | POA: Diagnosis not present

## 2023-01-06 DIAGNOSIS — M542 Cervicalgia: Secondary | ICD-10-CM

## 2023-01-07 ENCOUNTER — Institutional Professional Consult (permissible substitution): Payer: Self-pay | Admitting: Plastic Surgery

## 2023-01-10 NOTE — Therapy (Incomplete)
OUTPATIENT PHYSICAL THERAPY THORACOLUMBAR TREATMENT NOTE   Patient Name: Julie Vaughn MRN: [id number] DOB:Jan 23, 1986, 37 y.o., female Today's Date: 01/10/2023  END OF SESSION:       Past Medical History:  Diagnosis Date   History of multiple miscarriages    Obesity    Past Surgical History:  Procedure Laterality Date   APPENDECTOMY     INTRAUTERINE DEVICE (IUD) INSERTION     Patient Active Problem List   Diagnosis Date Noted   Symptomatic mammary hypertrophy 11/30/2022   Back pain 11/30/2022   Neck pain 11/30/2022   Gas pain 10/15/2022   Loose stools 10/15/2022   Right sided abdominal pain 10/15/2022   Abscess of right breast 10/11/2022   Macromastia 10/11/2022   Acute upper abdominal pain 04/02/2022   Elevated blood pressure reading 03/15/2019   Class 1 obesity due to excess calories with body mass index (BMI) of 30.0 to 30.9 in adult 03/15/2019   IUD (intrauterine device) in place 03/15/2019   History of multiple miscarriages     PCP: Silverio Decamp, MD   REFERRING PROVIDER: Wallace Going, DO   REFERRING DIAG:  N61.1 (ICD-10-CM) - Abscess of right breast  N62 (ICD-10-CM) - Symptomatic mammary hypertrophy  M54.6,G89.29 (ICD-10-CM) - Chronic bilateral thoracic back pain  M54.2 (ICD-10-CM) - Neck pain    Rationale for Evaluation and Treatment: Rehabilitation  THERAPY DIAG:  No diagnosis found.  ONSET DATE: 17 years ago  SUBJECTIVE:                                                                                                                                                                                           SUBJECTIVE STATEMENT:  *** PERTINENT HISTORY:   Pt is a pleasant 37 yo female referred to PT with dx of mammary hypertrophy, neck pain, and back pain. Pt reports pain started 17 years ago. She reports she experiences back pain throughout entire day, affecting her work day and her ability to sleep through the night. She  currently manages her pain with ibuprofen. Pt also with hx of recurrent abscesses between her breasts. She has been told not to wear bras with underwire as a result, but now must wear multiple sports brass to improve support/comfort. Her low back pain is currently 6/10. Pt also with chronic neck pain with occ R UE numbness. She has experienced RUE weakness in the past associated with her pain. She reports no LE weakness or issues with balance. Pt was attending crossfit at one time and reports this reduced her pain some but did not resolve it. Pt it trialing PT prior to breast reduction surgery.  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 Per chart, PMH significant for the following: elevated BP reading, hx of multiple miscarriages, abscess of R breast, macromastia, symptomatic mammary hypertrophy, back pain, neck pain, hx of appendectomy.  PAIN:  Are you having pain? Yes: NPRS scale: low back 4/10, mid back2/10 and shoulders 2/10 Pain location: neck, low back and upper back Pain description: aching, sharp "it wakes me up" Aggravating factors: activity Relieving factors: ibuprofen  PRECAUTIONS: Pt with hx of abscesses between breasts.   WEIGHT BEARING RESTRICTIONS: No  FALLS:  Has patient fallen in last 6 months? 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  NEXT MD VISIT: Pt has a follow-up appointment with her PA in March.  OBJECTIVE:    TODAY'S TREATMENT:                                                                                                                              DATE: 01/10/23  Supine: Open book stretch 5x each side TrA activation with swiss ball 10x 3 second holds TrA activation with swiss ball with UE raises ; 10x each UE TrA  activation with swiss ball dead bugs 10x each side 90 90 holds 30 seconds x 3 trials Swiss ball hamstring curls 10x Bridge 10x [place] to [employer] stretch 10x   Standing: Swiss ball wall squat 10x  Seated: Posterior pelvic tilt 10x Scapular retraction 10x  PATIENT EDUCATION:  Education details: further assessment findings, exercise technique, HEP Person educated: Patient Education method: Explanation, Demonstration, Tactile cues, Verbal cues, and Handouts Education comprehension: verbalized understanding and returned demonstration  HOME EXERCISE PROGRAM: Access Code: [email or web address] URL: [email or web address] Date: 12/29/2022 (updated)  Prepared by: Rivka Barbara  Exercises - Seated Shoulder Flexion Towel Slide at Table Top  - 1 x daily - 7 x weekly - 2 sets - 10 reps - Supine Figure 4 Piriformis Stretch  - 1 x daily - 7 x weekly - 2 sets - 2 reps - 30 seconds hold - Supine Lower Trunk Rotation  - 1 x daily - 7 x weekly - 10 reps - Clamshell with Resistance  - 1 x daily - 7 x weekly - 2 sets - 12 reps - Bridge with Hip Abduction and Resistance  - 1 x daily - 7 x weekly - 2 sets - 12 reps - Cat Cow  - 1 x daily - 7 x weekly - 1 sets - 10 reps - 5 sec  hold   Access Code: [email or web address] URL: [email or web address] Date: 01/06/2023 Prepared by: [place] with Swiss Ball  - 1 x daily - 7 x weekly - 2 sets - 10 reps - 5 hold - Dead Bug with Swiss  Ball  - 1 x daily - 7 x weekly - 2 sets - 10 reps - 5 hold - Seated Abdominal Press into [employer]  - 1 x daily - 7 x weekly - 2 sets - 10 reps - 5 hold - Supine Hamstring Swiss Ball Curls/Dynamic Mobilization  - 1 x daily - 7 x weekly - 2 sets - 10 reps - 5 hold - Supine Lower Trunk Rotation  - 1 x daily - 7 x weekly - 2 sets - 10 reps - 5 hold - Supine Bridge  - 1 x daily - 7 x weekly - 2 sets - 10 reps - 5 hold - Supine 90/90 Abdominal Bracing  - 1 x daily - 7 x weekly - 2 sets - 3 reps - 30  hold - Clamshell with Resistance  - 1 x daily - 7 x weekly - 2 sets - 10 reps - 5 hold ASSESSMENT:  CLINICAL IMPRESSION: *** Pt continues to demonstrate progress toward goals AEB progression of interventions this date either in volume or intensity.   OBJECTIVE IMPAIRMENTS: decreased activity tolerance, decreased mobility, difficulty walking, decreased ROM, hypomobility, impaired sensation, postural dysfunction, and pain.   ACTIVITY LIMITATIONS: bending, bed mobility, locomotion level, and general affect on ADLs with increased pain  PARTICIPATION LIMITATIONS: cleaning, laundry, shopping, community activity, occupation, and yard work  PERSONAL FACTORS: Sex, Time since onset of injury/illness/exacerbation, and 1-2 comorbidities: PMH significant for the following: elevated BP reading, hx of multiple miscarriages, abscess of R breast, macromastia, symptomatic mammary hypertrophy, back pain, neck pain, hx of appendectomy.  are also affecting patient's functional outcome.   REHAB POTENTIAL: Fair    CLINICAL DECISION MAKING: Evolving/moderate complexity  EVALUATION COMPLEXITY: Moderate   GOALS: Goals reviewed with patient? Yes  SHORT TERM GOALS: Target date: 12/27/2022    Patient will be independent in home exercise program to improve strength/mobility for better functional independence with ADLs. Baseline: to be initiated next 1-2 visits  Goal status: INITIAL   LONG TERM GOALS: Target date: 01/17/2023   Patient will increase FOTO score to equal to or greater than  71  to demonstrate statistically significant improvement in mobility and quality of life.  Baseline: 58 Goal status: INITIAL  2.   Patient will report a worst pain of 3/10 on VAS in             to improve tolerance with ADLs and reduced symptoms with activities.  Baseline: pt presents with 6/10 pain following taking pain medication Goal status: INITIAL  3.    Patient will reduce modified Oswestry score to <18 as to  demonstrate minimal disability with ADLs including improved sleeping tolerance, walking/sitting tolerance etc for better mobility with ADLs.  Baseline: 19% Goal status: INITIAL  4.  Patient will reduce Neck Disability Index score to <10% to demonstrate minimal disability with ADL's including improved sleeping tolerance, sitting tolerance, etc for better mobility at home and work. Baseline: 26% Goal status: INITIAL    PLAN:  PT FREQUENCY: 2x/week  PT DURATION: 6 weeks  PLANNED INTERVENTIONS: Therapeutic exercises, Therapeutic activity, Neuromuscular re-education, Balance training, Gait training, Patient/Family education, Self Care, Joint mobilization, Joint manipulation, Stair training, Vestibular training, Canalith repositioning, Orthotic/Fit training, DME instructions, Dry Needling, Electrical stimulation, Spinal mobilization, Cryotherapy, Moist heat, scar mobilization, Splintting, Taping, Traction, and Manual therapy.  PLAN FOR NEXT SESSION: update HEP for strengthening, strengthening, stretching, manual as indicated.    Janna Arch, PT 01/10/2023, 1:39 PM  1:39 PM, 01/10/23

## 2023-01-11 ENCOUNTER — Encounter: Payer: Self-pay | Admitting: Physical Therapy

## 2023-01-11 ENCOUNTER — Ambulatory Visit: Payer: 59 | Attending: Plastic Surgery | Admitting: Physical Therapy

## 2023-01-11 DIAGNOSIS — M6281 Muscle weakness (generalized): Secondary | ICD-10-CM | POA: Diagnosis not present

## 2023-01-11 DIAGNOSIS — M542 Cervicalgia: Secondary | ICD-10-CM | POA: Diagnosis not present

## 2023-01-11 DIAGNOSIS — M5459 Other low back pain: Secondary | ICD-10-CM | POA: Diagnosis not present

## 2023-01-11 NOTE — Therapy (Signed)
OUTPATIENT PHYSICAL THERAPY THORACOLUMBAR TREATMENT NOTE   Patient Name: Julie Vaughn MRN: [id number] DOB:November 29, 1985, 37 y.o., female Today's Date: 01/11/2023  END OF SESSION:  PT End of Session - 01/11/23 1528     Visit Number 8    Number of Visits 13    Date for PT Re-Evaluation 01/17/23    Authorization Type [employer]-    Authorization Time Period 12/06/22-01/17/23    Progress Note Due on Visit 10    PT Start Time 1523    PT Stop Time 1559    PT Time Calculation (min) 36 min    Activity Tolerance Patient tolerated treatment well    Behavior During Therapy WFL for tasks assessed/performed                Past Medical History:  Diagnosis Date   History of multiple miscarriages    Obesity    Past Surgical History:  Procedure Laterality Date   APPENDECTOMY     INTRAUTERINE DEVICE (IUD) INSERTION     Patient Active Problem List   Diagnosis Date Noted   Symptomatic mammary hypertrophy 11/30/2022   Back pain 11/30/2022   Neck pain 11/30/2022   Gas pain 10/15/2022   Loose stools 10/15/2022   Right sided abdominal pain 10/15/2022   Abscess of right breast 10/11/2022   Macromastia 10/11/2022   Acute upper abdominal pain 04/02/2022   Elevated blood pressure reading 03/15/2019   Class 1 obesity due to excess calories with body mass index (BMI) of 30.0 to 30.9 in adult 03/15/2019   IUD (intrauterine device) in place 03/15/2019   History of multiple miscarriages     PCP: Silverio Decamp, MD   REFERRING PROVIDER: Wallace Going, DO   REFERRING DIAG:  N61.1 (ICD-10-CM) - Abscess of right breast  N62 (ICD-10-CM) - Symptomatic mammary hypertrophy  M54.6,G89.29 (ICD-10-CM) - Chronic bilateral thoracic back pain  M54.2 (ICD-10-CM) - Neck pain    Rationale for Evaluation and Treatment: Rehabilitation  THERAPY DIAG:  Other low back pain  Cervicalgia  Muscle weakness (generalized)  ONSET DATE: 17 years ago  SUBJECTIVE:                                                                                                                                                                                            SUBJECTIVE STATEMENT:  Pt reports she is progressing with weight loss program and is okay as she feels she can continue with some of the healthy habits after the weight loss. She does not like the idea of weight loss supplements and is eager for more information regarding safe and healthy  weight loss.   PERTINENT HISTORY:   Pt is a pleasant 37 yo female referred to PT with dx of mammary hypertrophy, neck pain, and back pain. Pt reports pain started 17 years ago. She reports she experiences back pain throughout entire day, affecting her work day and her ability to sleep through the night. She currently manages her pain with ibuprofen. Pt also with hx of recurrent abscesses between her breasts. She has been told not to wear bras with underwire as a result, but now must wear multiple sports brass to improve support/comfort. Her low back pain is currently 6/10. Pt also with chronic neck pain with occ R UE numbness. She has experienced RUE weakness in the past associated with her pain. She reports no LE weakness or issues with balance. Pt was attending crossfit at one time and reports this reduced her pain some but did not resolve it. Pt it trialing PT prior to breast reduction surgery. 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 Per chart, PMH significant for the following: elevated BP reading, hx of multiple miscarriages, abscess of R breast, macromastia, symptomatic mammary hypertrophy, back pain, neck pain, hx of appendectomy.  PAIN:  Are you having pain? Yes: NPRS scale: low back 4/10, mid back2/10 and shoulders 2/10 Pain location: neck, low back and upper back Pain description: aching, sharp "it wakes me up" Aggravating factors: activity Relieving factors: ibuprofen  PRECAUTIONS: Pt with hx of abscesses between  breasts.   WEIGHT BEARING RESTRICTIONS: No  FALLS:  Has patient fallen in last 6 months? 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  NEXT MD VISIT: Pt has a follow-up appointment with her PA in March.  OBJECTIVE:    TODAY'S TREATMENT:                                                                                                                              DATE: 01/11/23  On [place]:  LTRs x 10 x 5 sec ea LE   Open book - 10x each side   Piriformis stretch, knee to chest stretch, figure 4 stretch, and hamstring stretch x 45 sec ea bilaterally   Glute bridge with GTB around knees to target gluteal musculature 2x12   TrA activation with swiss ball dead bugs 5x each side with proper form   Swiss ball hamstring curls 10x  Cat camel stretch x 10 ea   Shoulder extensions with PT holding band GTB 2 x 10   Shoulder horizontal abduction with GTB x 10 reps   Matrix cable machine rows x 17.5# 2 x 15 reps   Standing:  Swiss ball wall squat 10x   PATIENT EDUCATION:  Education details: further assessment findings, exercise technique, HEP Person  educated: Patient Education method: Explanation, Demonstration, Tactile cues, Verbal cues, and Handouts Education comprehension: verbalized understanding and returned demonstration  HOME EXERCISE PROGRAM: Access Code: [email or web address] URL: [email or web address] Date: 12/29/2022 (updated)  Prepared by: Rivka Barbara  Exercises - Seated Shoulder Flexion Towel Slide at Table Top  - 1 x daily - 7 x weekly - 2 sets - 10 reps - Supine Figure 4 Piriformis Stretch  - 1 x daily - 7 x weekly - 2 sets - 2 reps - 30 seconds hold - Supine Lower Trunk Rotation  - 1 x daily - 7 x weekly - 10 reps -  Clamshell with Resistance  - 1 x daily - 7 x weekly - 2 sets - 12 reps - Bridge with Hip Abduction and Resistance  - 1 x daily - 7 x weekly - 2 sets - 12 reps - Cat Cow  - 1 x daily - 7 x weekly - 1 sets - 10 reps - 5 sec  hold   Access Code: [email or web address] URL: [email or web address] Date: 01/06/2023 Prepared by: [place] with [place]  - 1 x daily - 7 x weekly - 2 sets - 10 reps - 5 hold - Dead Bug with Swiss Ball  - 1 x daily - 7 x weekly - 2 sets - 10 reps - 5 hold - Seated Abdominal Press into [employer]  - 1 x daily - 7 x weekly - 2 sets - 10 reps - 5 hold - Supine Hamstring Swiss Ball Curls/Dynamic Mobilization  - 1 x daily - 7 x weekly - 2 sets - 10 reps - 5 hold - Supine Lower Trunk Rotation  - 1 x daily - 7 x weekly - 2 sets - 10 reps - 5 hold - Supine Bridge  - 1 x daily - 7 x weekly - 2 sets - 10 reps - 5 hold - Supine 90/90 Abdominal Bracing  - 1 x daily - 7 x weekly - 2 sets - 3 reps - 30 hold - Clamshell with Resistance  - 1 x daily - 7 x weekly - 2 sets - 10 reps - 5 hold ASSESSMENT:  CLINICAL IMPRESSION:  Patient presents with excellent motivation throughout physical therapy session. Pt provided with further education regarding weight loss and safely doing this. Pt reports she was able to relieve some of her pain over the weekend with exercises from therapy and she was pleased with this. She is continued to be challenged with progressive core strengthening but  does not result in pain. Pt continues to demonstrate progress toward goals AEB progression of interventions this date either in volume or intensity. Pt will continue to benefit from skilled physical therapy intervention to address impairments, improve QOL, and attain therapy goals.     OBJECTIVE IMPAIRMENTS: decreased activity tolerance, decreased mobility, difficulty walking, decreased ROM, hypomobility, impaired sensation, postural dysfunction, and pain.   ACTIVITY  LIMITATIONS: bending, bed mobility, locomotion level, and general affect on ADLs with increased pain  PARTICIPATION LIMITATIONS: cleaning, laundry, shopping, community activity, occupation, and yard work  PERSONAL FACTORS: Sex, Time since onset of injury/illness/exacerbation, and 1-2 comorbidities: PMH significant for the following: elevated BP reading, hx of multiple miscarriages, abscess of R breast, macromastia, symptomatic mammary hypertrophy, back pain, neck pain, hx of appendectomy.  are also affecting patient's functional outcome.   REHAB POTENTIAL: Fair    CLINICAL DECISION MAKING: Evolving/moderate complexity  EVALUATION COMPLEXITY: Moderate   GOALS: Goals reviewed with patient? Yes  SHORT  TERM GOALS: Target date: 12/27/2022    Patient will be independent in home exercise program to improve strength/mobility for better functional independence with ADLs. Baseline: to be initiated next 1-2 visits  Goal status: INITIAL   LONG TERM GOALS: Target date: 01/17/2023   Patient will increase FOTO score to equal to or greater than  71  to demonstrate statistically significant improvement in mobility and quality of life.  Baseline: 58 Goal status: INITIAL  2.   Patient will report a worst pain of 3/10 on VAS in             to improve tolerance with ADLs and reduced symptoms with activities.  Baseline: pt presents with 6/10 pain following taking pain medication Goal status: INITIAL  3.    Patient will reduce modified Oswestry score to <18 as to demonstrate minimal disability with ADLs including improved sleeping tolerance, walking/sitting tolerance etc for better mobility with ADLs.  Baseline: 19% Goal status: INITIAL  4.  Patient will reduce Neck Disability Index score to <10% to demonstrate minimal disability with ADL's including improved sleeping tolerance, sitting tolerance, etc for better mobility at home and work. Baseline: 26% Goal status: INITIAL    PLAN:  PT  FREQUENCY: 2x/week  PT DURATION: 6 weeks  PLANNED INTERVENTIONS: Therapeutic exercises, Therapeutic activity, Neuromuscular re-education, Balance training, Gait training, Patient/Family education, Self Care, Joint mobilization, Joint manipulation, Stair training, Vestibular training, Canalith repositioning, Orthotic/Fit training, DME instructions, Dry Needling, Electrical stimulation, Spinal mobilization, Cryotherapy, Moist heat, scar mobilization, Splintting, Taping, Traction, and Manual therapy.  PLAN FOR NEXT SESSION: update HEP for strengthening, strengthening, stretching, manual as indicated.    Particia Lather, PT 01/11/2023, 5:16 PM  5:16 PM, 01/11/23

## 2023-01-12 NOTE — Therapy (Signed)
OUTPATIENT PHYSICAL THERAPY THORACOLUMBAR TREATMENT NOTE/RECERT   Patient Name: Julie Vaughn MRN: [id number] DOB:Nov 25, 1985, 37 y.o., female Today's Date: 01/13/2023  END OF SESSION:  PT End of Session - 01/13/23 1357     Visit Number 9    Number of Visits 21    Date for PT Re-Evaluation 02/24/23    Authorization Type [employer]-    Authorization Time Period 12/06/22-01/17/23    Progress Note Due on Visit 10    PT Start Time 1345    PT Stop Time 1429    PT Time Calculation (min) 44 min    Activity Tolerance Patient tolerated treatment well    Behavior During Therapy [facility] for tasks assessed/performed                 Past Medical History:  Diagnosis Date   History of multiple miscarriages    Obesity    Past Surgical History:  Procedure Laterality Date   APPENDECTOMY     INTRAUTERINE DEVICE (IUD) INSERTION     Patient Active Problem List   Diagnosis Date Noted   Symptomatic mammary hypertrophy 11/30/2022   Back pain 11/30/2022   Neck pain 11/30/2022   Gas pain 10/15/2022   Loose stools 10/15/2022   Right sided abdominal pain 10/15/2022   Abscess of right breast 10/11/2022   Macromastia 10/11/2022   Acute upper abdominal pain 04/02/2022   Elevated blood pressure reading 03/15/2019   Class 1 obesity due to excess calories with body mass index (BMI) of 30.0 to 30.9 in adult 03/15/2019   IUD (intrauterine device) in place 03/15/2019   History of multiple miscarriages     PCP: Silverio Decamp, MD   REFERRING PROVIDER: Wallace Going, DO   REFERRING DIAG:  N61.1 (ICD-10-CM) - Abscess of right breast  N62 (ICD-10-CM) - Symptomatic mammary hypertrophy  M54.6,G89.29 (ICD-10-CM) - Chronic bilateral thoracic back pain  M54.2 (ICD-10-CM) - Neck pain    Rationale for Evaluation and Treatment: Rehabilitation  THERAPY DIAG:  Other low back pain - Plan: PT plan of care cert/re-cert  Cervicalgia - Plan: PT plan of care  cert/re-cert  Muscle weakness (generalized) - Plan: PT plan of care cert/re-cert  ONSET DATE: 17 years ago  SUBJECTIVE:                                                                                                                                                                                           SUBJECTIVE STATEMENT:  Patient reports therapy has been helping. Feels 75% better, is waiting for surgery.  PERTINENT HISTORY:   Pt is a pleasant 37 yo female referred  to PT with dx of mammary hypertrophy, neck pain, and back pain. Pt reports pain started 17 years ago. She reports she experiences back pain throughout entire day, affecting her work day and her ability to sleep through the night. She currently manages her pain with ibuprofen. Pt also with hx of recurrent abscesses between her breasts. She has been told not to wear bras with underwire as a result, but now must wear multiple sports brass to improve support/comfort. Her low back pain is currently 6/10. Pt also with chronic neck pain with occ R UE numbness. She has experienced RUE weakness in the past associated with her pain. She reports no LE weakness or issues with balance. Pt was attending crossfit at one time and reports this reduced her pain some but did not resolve it. Pt it trialing PT prior to breast reduction surgery. 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 Per chart, PMH significant for the following: elevated BP reading, hx of multiple miscarriages, abscess of R breast, macromastia, symptomatic mammary hypertrophy, back pain, neck pain, hx of appendectomy.  PAIN:  Are you having pain? Yes: NPRS scale: low back 4/10, mid back2/10 and shoulders 2/10 Pain location: neck, low back and upper back Pain description: aching, sharp "it wakes me up" Aggravating factors: activity Relieving factors: ibuprofen  PRECAUTIONS: Pt with hx of abscesses between breasts.   WEIGHT BEARING RESTRICTIONS: No  FALLS:  Has  patient fallen in last 6 months? 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  NEXT MD VISIT: Pt has a follow-up appointment with her PA in March.  OBJECTIVE:    TODAY'S TREATMENT:                                                                                                                              DATE: 01/13/23 Goals: Performed Below:    On Plinth: Supine: RTB overhead Y RTB ER 15x  TrA march single leg focus for core activation Reverse frogger core activation 10x each LE  Pilates pulse 30 seconds LE rotation 60 seconds  Standing  RTB straight arm lat pull down 10x  RTB row 10x   PATIENT EDUCATION:  Education details: further assessment findings, exercise technique, HEP Person educated: Patient Education method: Explanation, Demonstration, Tactile cues, Verbal cues, and Handouts Education comprehension: verbalized understanding and returned demonstration  HOME EXERCISE PROGRAM: Access Code: [email or web address] URL: [email or web address] Date: 12/29/2022 (updated)  Prepared by: Rivka Barbara  Exercises - Seated Shoulder Flexion Towel Slide at Table Top  - 1 x daily - 7 x weekly - 2 sets - 10 reps - Supine Figure 4 Piriformis Stretch  - 1 x daily - 7 x weekly - 2 sets - 2 reps - 30 seconds  hold - Supine Lower Trunk Rotation  - 1 x daily - 7 x weekly - 10 reps - Clamshell with Resistance  - 1 x daily - 7 x weekly - 2 sets - 12 reps - Bridge with Hip Abduction and Resistance  - 1 x daily - 7 x weekly - 2 sets - 12 reps - Cat Cow  - 1 x daily - 7 x weekly - 1 sets - 10 reps - 5 sec  hold   Access Code: [email or web address] URL: [email or web address] Date: 01/06/2023 Prepared by: [place] with  [place]  - 1 x daily - 7 x weekly - 2 sets - 10 reps - 5 hold - Dead Bug with Swiss Ball  - 1 x daily - 7 x weekly - 2 sets - 10 reps - 5 hold - Seated Abdominal Press into [employer]  - 1 x daily - 7 x weekly - 2 sets - 10 reps - 5 hold - Supine Hamstring Swiss Ball Curls/Dynamic Mobilization  - 1 x daily - 7 x weekly - 2 sets - 10 reps - 5 hold - Supine Lower Trunk Rotation  - 1 x daily - 7 x weekly - 2 sets - 10 reps - 5 hold - Supine Bridge  - 1 x daily - 7 x weekly - 2 sets - 10 reps - 5 hold - Supine 90/90 Abdominal Bracing  - 1 x daily - 7 x weekly - 2 sets - 3 reps - 30 hold - Clamshell with Resistance  - 1 x daily - 7 x weekly - 2 sets - 10 reps - 5 hold ASSESSMENT:  CLINICAL IMPRESSION:  Patient is making progress towards goals at this time. Awaiting surgery for full relief of symptoms with focus of PT for stabilization, strengthening, and pain reduction. Patient met MODI score, new goal for functional address of gym program added to POC.  Pt continues to demonstrate progress toward goals AEB progression of interventions this date either in volume or intensity. Pt will continue to benefit from skilled physical therapy intervention to address impairments, improve QOL, and attain therapy goals.     OBJECTIVE IMPAIRMENTS: decreased activity tolerance, decreased mobility, difficulty walking, decreased ROM, hypomobility, impaired sensation, postural dysfunction, and pain.   ACTIVITY LIMITATIONS: bending, bed mobility, locomotion level, and general affect on ADLs with increased pain  PARTICIPATION LIMITATIONS: cleaning, laundry, shopping, community activity, occupation, and yard work  PERSONAL FACTORS: Sex, Time since onset of injury/illness/exacerbation, and 1-2 comorbidities: PMH significant for the following: elevated BP reading, hx of multiple miscarriages, abscess of R breast, macromastia, symptomatic mammary hypertrophy, back pain, neck pain, hx of appendectomy.  are also affecting  patient's functional outcome.   REHAB POTENTIAL: Fair    CLINICAL DECISION MAKING: Evolving/moderate complexity  EVALUATION COMPLEXITY: Moderate   GOALS: Goals reviewed with patient? Yes  SHORT TERM GOALS: Target date:01/27/2023      Patient will be independent in home exercise program to improve strength/mobility for better functional independence with ADLs. Baseline: to be initiated next 1-2 visits 3/7: HEP compliance  Goal status: Partially Met   LONG TERM GOALS: Target date: 02/24/2023     Patient will increase FOTO score to equal to or greater than  71  to demonstrate statistically significant improvement in mobility and quality of life.  Baseline: 58 3/7: 61% Goal status: Partially Met  2.   Patient will report a worst pain of 3/10 on VAS in  lower back  to improve tolerance with ADLs and reduced symptoms with activities.  Baseline: pt presents with 6/10 pain following taking pain medication 3/7: 4-5/10  Goal status: Partially Met  3.    Patient will reduce modified Oswestry score to <18 as to demonstrate minimal disability with ADLs including improved sleeping tolerance, walking/sitting tolerance etc for better mobility with ADLs.  Baseline: 19% 3/7: 15% Goal status: MET  4.  Patient will reduce Neck Disability Index score to <10% to demonstrate minimal disability with ADL's including improved sleeping tolerance, sitting tolerance, etc for better mobility at home and work. Baseline: 26% 3/7: 22% Goal status: Partially Met  5.  Patient will be independent with a strengthening gym protocol with minimal (<3/10 ) pain increase.  Baseline: new Goal status: NEW   PLAN:  PT FREQUENCY: 2x/week  PT DURATION: 6 weeks  PLANNED INTERVENTIONS: Therapeutic exercises, Therapeutic activity, Neuromuscular re-education, Balance training, Gait training, Patient/Family education, Self Care, Joint mobilization, Joint manipulation, Stair training, Vestibular training, Canalith  repositioning, Orthotic/Fit training, DME instructions, Dry Needling, Electrical stimulation, Spinal mobilization, Cryotherapy, Moist heat, scar mobilization, Splintting, Taping, Traction, and Manual therapy.  PLAN FOR NEXT SESSION: update HEP for strengthening, strengthening, stretching, manual as indicated.    Janna Arch, PT 01/13/2023, 2:30 PM  2:30 PM, 01/13/23

## 2023-01-13 ENCOUNTER — Ambulatory Visit: Payer: 59

## 2023-01-13 DIAGNOSIS — M6281 Muscle weakness (generalized): Secondary | ICD-10-CM | POA: Diagnosis not present

## 2023-01-13 DIAGNOSIS — M5459 Other low back pain: Secondary | ICD-10-CM

## 2023-01-13 DIAGNOSIS — M542 Cervicalgia: Secondary | ICD-10-CM

## 2023-01-17 NOTE — Therapy (Signed)
OUTPATIENT PHYSICAL THERAPY THORACOLUMBAR TREATMENT NOTE/Physical Therapy Progress Note   Dates of reporting period  12/06/22   to   01/18/23    Patient Name: Julie Vaughn MRN: [id number] DOB:11-06-1986, 37 y.o., female Today's Date: 01/18/2023  END OF SESSION:  PT End of Session - 01/18/23 1602     Visit Number 10    Number of Visits 21    Date for PT Re-Evaluation 02/24/23    Authorization Type [employer]-    Authorization Time Period 12/06/22-01/17/23    Progress Note Due on Visit 10    PT Start Time 1601    PT Stop Time 1644    PT Time Calculation (min) 43 min    Activity Tolerance Patient tolerated treatment well    Behavior During Therapy [facility] for tasks assessed/performed                  Past Medical History:  Diagnosis Date   History of multiple miscarriages    Obesity    Past Surgical History:  Procedure Laterality Date   APPENDECTOMY     INTRAUTERINE DEVICE (IUD) INSERTION     Patient Active Problem List   Diagnosis Date Noted   Symptomatic mammary hypertrophy 11/30/2022   Back pain 11/30/2022   Neck pain 11/30/2022   Gas pain 10/15/2022   Loose stools 10/15/2022   Right sided abdominal pain 10/15/2022   Abscess of right breast 10/11/2022   Macromastia 10/11/2022   Acute upper abdominal pain 04/02/2022   Elevated blood pressure reading 03/15/2019   Class 1 obesity due to excess calories with body mass index (BMI) of 30.0 to 30.9 in adult 03/15/2019   IUD (intrauterine device) in place 03/15/2019   History of multiple miscarriages     PCP: Silverio Decamp, MD   REFERRING PROVIDER: Wallace Going, DO   REFERRING DIAG:  N61.1 (ICD-10-CM) - Abscess of right breast  N62 (ICD-10-CM) - Symptomatic mammary hypertrophy  M54.6,G89.29 (ICD-10-CM) - Chronic bilateral thoracic back pain  M54.2 (ICD-10-CM) - Neck pain    Rationale for Evaluation and Treatment: Rehabilitation  THERAPY DIAG:  Other low back  pain  Muscle weakness (generalized)  Cervicalgia  ONSET DATE: 17 years ago  SUBJECTIVE:                                                                                                                                                                                           SUBJECTIVE STATEMENT:  Patient coming from work today. Is having some lower back pain. Had a light day at work today. Was at home yesterday and doing walk had a lot  of pain in her midback.   PERTINENT HISTORY:   Pt is a pleasant 37 yo female referred to PT with dx of mammary hypertrophy, neck pain, and back pain. Pt reports pain started 17 years ago. She reports she experiences back pain throughout entire day, affecting her work day and her ability to sleep through the night. She currently manages her pain with ibuprofen. Pt also with hx of recurrent abscesses between her breasts. She has been told not to wear bras with underwire as a result, but now must wear multiple sports brass to improve support/comfort. Her low back pain is currently 6/10. Pt also with chronic neck pain with occ R UE numbness. She has experienced RUE weakness in the past associated with her pain. She reports no LE weakness or issues with balance. Pt was attending crossfit at one time and reports this reduced her pain some but did not resolve it. Pt it trialing PT prior to breast reduction surgery. 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 Per chart, PMH significant for the following: elevated BP reading, hx of multiple miscarriages, abscess of R breast, macromastia, symptomatic mammary hypertrophy, back pain, neck pain, hx of appendectomy.  PAIN:  Are you having pain? Yes: NPRS scale: low back 4/10, mid back2/10 and shoulders 2/10 Pain location: neck, low back and upper back Pain description: aching, sharp "it wakes me up" Aggravating factors: activity Relieving factors: ibuprofen  PRECAUTIONS: Pt with hx of abscesses between breasts.    WEIGHT BEARING RESTRICTIONS: No  FALLS:  Has patient fallen in last 6 months? 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  NEXT MD VISIT: Pt has a follow-up appointment with her PA in March.  OBJECTIVE:    TODAY'S TREATMENT:                                                                                                                              DATE: 01/18/23    On Plinth: Supine: RTB overhead Y 20x  RTB ER 15x  RTB straight arm extension 15x each side  TrA march single leg focus for core activation LE rotation 60 seconds Adduction ball squeeze with posterior pelvic tilt 15x RTB abduction with bridge 10x   seated RTB paloff press 15x each side Car positioning education    PATIENT EDUCATION:  Education details: further assessment findings, exercise technique, HEP Person educated: Patient Education method: Explanation, Demonstration, Tactile cues, Verbal cues, and Handouts Education comprehension: verbalized understanding and returned demonstration  HOME EXERCISE PROGRAM: Access Code: [email or web address] URL: [email or web address] Date: 12/29/2022 (updated)  Prepared by: Rivka Barbara  Exercises - Seated Shoulder Flexion Towel Slide at Table Top  - 1 x daily - 7 x weekly - 2 sets -  10 reps - Supine Figure 4 Piriformis Stretch  - 1 x daily - 7 x weekly - 2 sets - 2 reps - 30 seconds hold - Supine Lower Trunk Rotation  - 1 x daily - 7 x weekly - 10 reps - Clamshell with Resistance  - 1 x daily - 7 x weekly - 2 sets - 12 reps - Bridge with Hip Abduction and Resistance  - 1 x daily - 7 x weekly - 2 sets - 12 reps - Cat Cow  - 1 x daily - 7 x weekly - 1 sets - 10 reps - 5 sec  hold   Access Code: [email or web address] URL:  [email or web address] Date: 01/06/2023 Prepared by: [place] with [place]  - 1 x daily - 7 x weekly - 2 sets - 10 reps - 5 hold - Dead Bug with Swiss Ball  - 1 x daily - 7 x weekly - 2 sets - 10 reps - 5 hold - Seated Abdominal Press into [employer]  - 1 x daily - 7 x weekly - 2 sets - 10 reps - 5 hold - Supine Hamstring Swiss Ball Curls/Dynamic Mobilization  - 1 x daily - 7 x weekly - 2 sets - 10 reps - 5 hold - Supine Lower Trunk Rotation  - 1 x daily - 7 x weekly - 2 sets - 10 reps - 5 hold - Supine Bridge  - 1 x daily - 7 x weekly - 2 sets - 10 reps - 5 hold - Supine 90/90 Abdominal Bracing  - 1 x daily - 7 x weekly - 2 sets - 3 reps - 30 hold - Clamshell with Resistance  - 1 x daily - 7 x weekly - 2 sets - 10 reps - 5 hold ASSESSMENT:  CLINICAL IMPRESSION:  Goals performed last session, please refer to previous notes for further information Patient's condition has the potential to improve in response to therapy. Maximum improvement is yet to be obtained. The anticipated improvement is attainable and reasonable in a generally predictable time.  Pt will continue to benefit from skilled physical therapy intervention to address impairments, improve QOL, and attain therapy goals.     OBJECTIVE IMPAIRMENTS: decreased activity tolerance, decreased mobility, difficulty walking, decreased ROM, hypomobility, impaired sensation, postural dysfunction, and pain.   ACTIVITY LIMITATIONS: bending, bed mobility, locomotion level, and general affect on ADLs with increased pain  PARTICIPATION LIMITATIONS: cleaning, laundry, shopping, community activity, occupation, and yard work  PERSONAL FACTORS: Sex, Time since onset of injury/illness/exacerbation, and 1-2 comorbidities: PMH significant for the following: elevated BP reading, hx of multiple miscarriages, abscess of R breast, macromastia, symptomatic mammary hypertrophy, back pain, neck pain, hx of  appendectomy.  are also affecting patient's functional outcome.   REHAB POTENTIAL: Fair    CLINICAL DECISION MAKING: Evolving/moderate complexity  EVALUATION COMPLEXITY: Moderate   GOALS: Goals reviewed with patient? Yes  SHORT TERM GOALS: Target date:01/27/2023      Patient will be independent in home exercise program to improve strength/mobility for better functional independence with ADLs. Baseline: to be initiated next 1-2 visits 3/7: HEP compliance  Goal status: Partially Met   LONG TERM GOALS: Target date: 02/24/2023     Patient will increase FOTO score to equal to or greater than  71  to demonstrate statistically significant improvement in mobility and quality of life.  Baseline: 58 3/7: 61% Goal status: Partially Met  2.   Patient will report a worst pain  of 3/10 on VAS in  lower back  to improve tolerance with ADLs and reduced symptoms with activities.  Baseline: pt presents with 6/10 pain following taking pain medication 3/7: 4-5/10  Goal status: Partially Met  3.    Patient will reduce modified Oswestry score to <18 as to demonstrate minimal disability with ADLs including improved sleeping tolerance, walking/sitting tolerance etc for better mobility with ADLs.  Baseline: 19% 3/7: 15% Goal status: MET  4.  Patient will reduce Neck Disability Index score to <10% to demonstrate minimal disability with ADL's including improved sleeping tolerance, sitting tolerance, etc for better mobility at home and work. Baseline: 26% 3/7: 22% Goal status: Partially Met  5.  Patient will be independent with a strengthening gym protocol with minimal (<3/10 ) pain increase.  Baseline: new Goal status: NEW   PLAN:  PT FREQUENCY: 2x/week  PT DURATION: 6 weeks  PLANNED INTERVENTIONS: Therapeutic exercises, Therapeutic activity, Neuromuscular re-education, Balance training, Gait training, Patient/Family education, Self Care, Joint mobilization, Joint manipulation, Stair  training, Vestibular training, Canalith repositioning, Orthotic/Fit training, DME instructions, Dry Needling, Electrical stimulation, Spinal mobilization, Cryotherapy, Moist heat, scar mobilization, Splintting, Taping, Traction, and Manual therapy.  PLAN FOR NEXT SESSION: update HEP for strengthening, strengthening, stretching, manual as indicated.    Janna Arch, PT 01/18/2023, 8:25 PM  8:25 PM, 01/18/23

## 2023-01-18 ENCOUNTER — Ambulatory Visit: Payer: 59

## 2023-01-18 DIAGNOSIS — M6281 Muscle weakness (generalized): Secondary | ICD-10-CM

## 2023-01-18 DIAGNOSIS — M542 Cervicalgia: Secondary | ICD-10-CM | POA: Diagnosis not present

## 2023-01-18 DIAGNOSIS — M5459 Other low back pain: Secondary | ICD-10-CM

## 2023-01-19 NOTE — Therapy (Incomplete)
OUTPATIENT PHYSICAL THERAPY THORACOLUMBAR TREATMENT NOTE    Patient Name: Julie Vaughn MRN: [id number] DOB:23-Sep-1986, 37 y.o., female Today's Date: 01/19/2023  END OF SESSION:         Past Medical History:  Diagnosis Date   History of multiple miscarriages    Obesity    Past Surgical History:  Procedure Laterality Date   APPENDECTOMY     INTRAUTERINE DEVICE (IUD) INSERTION     Patient Active Problem List   Diagnosis Date Noted   Symptomatic mammary hypertrophy 11/30/2022   Back pain 11/30/2022   Neck pain 11/30/2022   Gas pain 10/15/2022   Loose stools 10/15/2022   Right sided abdominal pain 10/15/2022   Abscess of right breast 10/11/2022   Macromastia 10/11/2022   Acute upper abdominal pain 04/02/2022   Elevated blood pressure reading 03/15/2019   Class 1 obesity due to excess calories with body mass index (BMI) of 30.0 to 30.9 in adult 03/15/2019   IUD (intrauterine device) in place 03/15/2019   History of multiple miscarriages     PCP: Silverio Decamp, MD   REFERRING PROVIDER: Wallace Going, DO   REFERRING DIAG:  N61.1 (ICD-10-CM) - Abscess of right breast  N62 (ICD-10-CM) - Symptomatic mammary hypertrophy  M54.6,G89.29 (ICD-10-CM) - Chronic bilateral thoracic back pain  M54.2 (ICD-10-CM) - Neck pain    Rationale for Evaluation and Treatment: Rehabilitation  THERAPY DIAG:  No diagnosis found.  ONSET DATE: 17 years ago  SUBJECTIVE:                                                                                                                                                                                           SUBJECTIVE STATEMENT:  ***  PERTINENT HISTORY:   Pt is a pleasant 37 yo female referred to PT with dx of mammary hypertrophy, neck pain, and back pain. Pt reports pain started 17 years ago. She reports she experiences back pain throughout entire day, affecting her work day and her ability to sleep through the  night. She currently manages her pain with ibuprofen. Pt also with hx of recurrent abscesses between her breasts. She has been told not to wear bras with underwire as a result, but now must wear multiple sports brass to improve support/comfort. Her low back pain is currently 6/10. Pt also with chronic neck pain with occ R UE numbness. She has experienced RUE weakness in the past associated with her pain. She reports no LE weakness or issues with balance. Pt was attending crossfit at one time and reports this reduced her pain some but did not resolve it. Pt it trialing PT prior  to breast reduction surgery. 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 Per chart, PMH significant for the following: elevated BP reading, hx of multiple miscarriages, abscess of R breast, macromastia, symptomatic mammary hypertrophy, back pain, neck pain, hx of appendectomy.  PAIN:  Are you having pain? Yes: NPRS scale: low back 4/10, mid back2/10 and shoulders 2/10 Pain location: neck, low back and upper back Pain description: aching, sharp "it wakes me up" Aggravating factors: activity Relieving factors: ibuprofen  PRECAUTIONS: Pt with hx of abscesses between breasts.   WEIGHT BEARING RESTRICTIONS: No  FALLS:  Has patient fallen in last 6 months? 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  NEXT MD VISIT: Pt has a follow-up appointment with her PA in March.  OBJECTIVE:    TODAY'S TREATMENT:                                                                                                                              DATE: 01/19/23    On Plinth: Supine: RTB overhead Y 20x  RTB ER 15x  RTB straight arm extension 15x each side  TrA march single leg focus for core  activation LE rotation 60 seconds Adduction ball squeeze with posterior pelvic tilt 15x RTB abduction with bridge 10x   seated RTB paloff press 15x each side Car positioning education    PATIENT EDUCATION:  Education details: further assessment findings, exercise technique, HEP Person educated: Patient Education method: Explanation, Demonstration, Tactile cues, Verbal cues, and Handouts Education comprehension: verbalized understanding and returned demonstration  HOME EXERCISE PROGRAM: Access Code: [email or web address] URL: [email or web address] Date: 12/29/2022 (updated)  Prepared by: Rivka Barbara  Exercises - Seated Shoulder Flexion Towel Slide at Table Top  - 1 x daily - 7 x weekly - 2 sets - 10 reps - Supine Figure 4 Piriformis Stretch  - 1 x daily - 7 x weekly - 2 sets - 2 reps - 30 seconds hold - Supine Lower Trunk Rotation  - 1 x daily - 7 x weekly - 10 reps - Clamshell with Resistance  - 1 x daily - 7 x weekly - 2 sets - 12 reps - Bridge with Hip Abduction and Resistance  - 1 x daily - 7 x weekly - 2 sets - 12 reps - Cat Cow  - 1 x daily - 7 x weekly - 1 sets - 10 reps - 5 sec  hold   Access Code: [email or web address] URL: [email or web address] Date: 01/06/2023 Prepared by: Lyons Switch with Swiss Ball  - 1 x daily - 7 x weekly - 2 sets - 10 reps - 5 hold - Dead Bug with Swiss Ball  - 1 x daily - 7 x weekly -  2 sets - 10 reps - 5 hold - Seated Abdominal Press into [employer]  - 1 x daily - 7 x weekly - 2 sets - 10 reps - 5 hold - Supine Hamstring Swiss Ball Curls/Dynamic Mobilization  - 1 x daily - 7 x weekly - 2 sets - 10 reps - 5 hold - Supine Lower Trunk Rotation  - 1 x daily - 7 x weekly - 2 sets - 10 reps - 5 hold - Supine Bridge  - 1 x daily - 7 x weekly - 2 sets - 10 reps - 5 hold - Supine 90/90 Abdominal Bracing  - 1 x daily - 7 x weekly - 2 sets - 3 reps - 30 hold - Clamshell with Resistance  - 1 x daily - 7 x weekly - 2  sets - 10 reps - 5 hold ASSESSMENT:  CLINICAL IMPRESSION: ***Pt will continue to benefit from skilled physical therapy intervention to address impairments, improve QOL, and attain therapy goals.     OBJECTIVE IMPAIRMENTS: decreased activity tolerance, decreased mobility, difficulty walking, decreased ROM, hypomobility, impaired sensation, postural dysfunction, and pain.   ACTIVITY LIMITATIONS: bending, bed mobility, locomotion level, and general affect on ADLs with increased pain  PARTICIPATION LIMITATIONS: cleaning, laundry, shopping, community activity, occupation, and yard work  PERSONAL FACTORS: Sex, Time since onset of injury/illness/exacerbation, and 1-2 comorbidities: PMH significant for the following: elevated BP reading, hx of multiple miscarriages, abscess of R breast, macromastia, symptomatic mammary hypertrophy, back pain, neck pain, hx of appendectomy.  are also affecting patient's functional outcome.   REHAB POTENTIAL: Fair    CLINICAL DECISION MAKING: Evolving/moderate complexity  EVALUATION COMPLEXITY: Moderate   GOALS: Goals reviewed with patient? Yes  SHORT TERM GOALS: Target date:01/27/2023      Patient will be independent in home exercise program to improve strength/mobility for better functional independence with ADLs. Baseline: to be initiated next 1-2 visits 3/7: HEP compliance  Goal status: Partially Met   LONG TERM GOALS: Target date: 02/24/2023     Patient will increase FOTO score to equal to or greater than  71  to demonstrate statistically significant improvement in mobility and quality of life.  Baseline: 58 3/7: 61% Goal status: Partially Met  2.   Patient will report a worst pain of 3/10 on VAS in  lower back  to improve tolerance with ADLs and reduced symptoms with activities.  Baseline: pt presents with 6/10 pain following taking pain medication 3/7: 4-5/10  Goal status: Partially Met  3.    Patient will reduce modified Oswestry score  to <18 as to demonstrate minimal disability with ADLs including improved sleeping tolerance, walking/sitting tolerance etc for better mobility with ADLs.  Baseline: 19% 3/7: 15% Goal status: MET  4.  Patient will reduce Neck Disability Index score to <10% to demonstrate minimal disability with ADL's including improved sleeping tolerance, sitting tolerance, etc for better mobility at home and work. Baseline: 26% 3/7: 22% Goal status: Partially Met  5.  Patient will be independent with a strengthening gym protocol with minimal (<3/10 ) pain increase.  Baseline: new Goal status: NEW   PLAN:  PT FREQUENCY: 2x/week  PT DURATION: 6 weeks  PLANNED INTERVENTIONS: Therapeutic exercises, Therapeutic activity, Neuromuscular re-education, Balance training, Gait training, Patient/Family education, Self Care, Joint mobilization, Joint manipulation, Stair training, Vestibular training, Canalith repositioning, Orthotic/Fit training, DME instructions, Dry Needling, Electrical stimulation, Spinal mobilization, Cryotherapy, Moist heat, scar mobilization, Splintting, Taping, Traction, and Manual therapy.  PLAN FOR NEXT SESSION: update  HEP for strengthening, strengthening, stretching, manual as indicated.    Janna Arch, PT 01/19/2023, 12:33 PM  12:33 PM, 01/19/23

## 2023-01-20 ENCOUNTER — Encounter: Payer: Self-pay | Admitting: Physical Therapy

## 2023-01-20 ENCOUNTER — Ambulatory Visit: Payer: 59 | Admitting: Physical Therapy

## 2023-01-20 DIAGNOSIS — M542 Cervicalgia: Secondary | ICD-10-CM

## 2023-01-20 DIAGNOSIS — M6281 Muscle weakness (generalized): Secondary | ICD-10-CM

## 2023-01-20 DIAGNOSIS — M5459 Other low back pain: Secondary | ICD-10-CM | POA: Diagnosis not present

## 2023-01-20 NOTE — Therapy (Signed)
OUTPATIENT PHYSICAL THERAPY THORACOLUMBAR TREATMENT NOTE   Patient Name: Julie Vaughn MRN: [id number] DOB:11-30-1985, 37 y.o., female Today's Date: 01/20/2023  END OF SESSION:  PT End of Session - 01/20/23 1520     Visit Number 11    Number of Visits 21    Date for PT Re-Evaluation 02/24/23    Authorization Type [employer] Employee-    Authorization Time Period 12/06/22-01/17/23    Progress Note Due on Visit 10    PT Start Time 1521    PT Stop Time 1600    PT Time Calculation (min) 39 min    Activity Tolerance Patient tolerated treatment well    Behavior During Therapy [facility] for tasks assessed/performed                  Past Medical History:  Diagnosis Date   History of multiple miscarriages    Obesity    Past Surgical History:  Procedure Laterality Date   APPENDECTOMY     INTRAUTERINE DEVICE (IUD) INSERTION     Patient Active Problem List   Diagnosis Date Noted   Symptomatic mammary hypertrophy 11/30/2022   Back pain 11/30/2022   Neck pain 11/30/2022   Gas pain 10/15/2022   Loose stools 10/15/2022   Right sided abdominal pain 10/15/2022   Abscess of right breast 10/11/2022   Macromastia 10/11/2022   Acute upper abdominal pain 04/02/2022   Elevated blood pressure reading 03/15/2019   Class 1 obesity due to excess calories with body mass index (BMI) of 30.0 to 30.9 in adult 03/15/2019   IUD (intrauterine device) in place 03/15/2019   History of multiple miscarriages     PCP: Julie Decamp, MD   REFERRING PROVIDER: Wallace Going, DO   REFERRING DIAG:  N61.1 (ICD-10-CM) - Abscess of right breast  N62 (ICD-10-CM) - Symptomatic mammary hypertrophy  M54.6,G89.29 (ICD-10-CM) - Chronic bilateral thoracic back pain  M54.2 (ICD-10-CM) - Neck pain    Rationale for Evaluation and Treatment: Rehabilitation  THERAPY DIAG:  No diagnosis found.  ONSET DATE: 17 years ago  SUBJECTIVE:                                                                                                                                                                                            SUBJECTIVE STATEMENT:  Patient coming from work today. Is having some pain in her upper back at this time but lower back is not as intense this date.   PERTINENT HISTORY:   Pt is a pleasant 37 yo female referred to PT with dx of mammary hypertrophy, neck pain, and back pain. Pt reports  pain started 17 years ago. She reports she experiences back pain throughout entire day, affecting her work day and her ability to sleep through the night. She currently manages her pain with ibuprofen. Pt also with hx of recurrent abscesses between her breasts. She has been told not to wear bras with underwire as a result, but now must wear multiple sports brass to improve support/comfort. Her low back pain is currently 6/10. Pt also with chronic neck pain with occ R UE numbness. She has experienced RUE weakness in the past associated with her pain. She reports no LE weakness or issues with balance. Pt was attending crossfit at one time and reports this reduced her pain some but did not resolve it. Pt it trialing PT prior to breast reduction surgery. 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 Per chart, PMH significant for the following: elevated BP reading, hx of multiple miscarriages, abscess of R breast, macromastia, symptomatic mammary hypertrophy, back pain, neck pain, hx of appendectomy.  PAIN:  Are you having pain? Yes: NPRS scale: low back 3/10, mid back 5/10 and shoulders 2/10 Pain location: neck, low back and upper back Pain description: aching, sharp "it wakes me up" Aggravating factors: activity Relieving factors: ibuprofen  PRECAUTIONS: Pt with hx of abscesses between breasts.   WEIGHT BEARING RESTRICTIONS: No  FALLS:  Has patient fallen in last 6 months? 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  NEXT MD VISIT: Pt has a follow-up appointment with her PA in March.  OBJECTIVE:    TODAY'S TREATMENT:                                                                                                                              DATE: 01/20/23    On Plinth: Supine: LE rotation 60 seconds Open Book stretch x 10 reps ea side  RTB overhead Y 20x  RTB horizontal ABD 2 x 10 reps  RTB ER 2*10 reps  RTB straight arm extension 15x each side  Adduction ball squeeze with posterior pelvic tilt 15 x 5 sec holds  GTB abduction with bridge for increased gluteal activation 2 x 10 reps  Upper back mobilization with foam roller gentle stretch x 3 reps ea at mid and upper thoracic spine   PATIENT EDUCATION:  Education details: further assessment findings, exercise technique, HEP Person educated: Patient Education method: Explanation, Demonstration, Tactile cues, Verbal cues, and Handouts Education comprehension: verbalized understanding and returned demonstration  HOME EXERCISE PROGRAM: Access Code: [email or web address] URL: [email or web address] Date: 12/29/2022 (updated)  Prepared by: Julie Vaughn  Exercises - Seated Shoulder Flexion Towel Slide at Table Top  - 1 x daily - 7 x weekly - 2 sets - 10 reps - Supine  Figure 4 Piriformis Stretch  - 1 x daily - 7 x weekly - 2 sets - 2 reps - 30 seconds hold - Supine Lower Trunk Rotation  - 1 x daily - 7 x weekly - 10 reps - Clamshell with Resistance  - 1 x daily - 7 x weekly - 2 sets - 12 reps - Bridge with Hip Abduction and Resistance  - 1 x daily - 7 x weekly - 2 sets - 12 reps - Cat Cow  - 1 x daily - 7 x weekly - 1 sets - 10 reps - 5 sec  hold   Access Code: [email or web address] URL: [email or web address] Date: 01/06/2023 Prepared by: Julie Vaughn with Ester  - 1 x daily - 7 x weekly - 2 sets - 10 reps - 5 hold - Dead Bug with Swiss Ball  - 1 x daily - 7 x weekly - 2 sets - 10 reps - 5 hold - Seated Abdominal Press into [employer]  - 1 x daily - 7 x weekly - 2 sets - 10 reps - 5 hold - Supine Hamstring Swiss Ball Curls/Dynamic Mobilization  - 1 x daily - 7 x weekly - 2 sets - 10 reps - 5 hold - Supine Lower Trunk Rotation  - 1 x daily - 7 x weekly - 2 sets - 10 reps - 5 hold - Supine Bridge  - 1 x daily - 7 x weekly - 2 sets - 10 reps - 5 hold - Supine 90/90 Abdominal Bracing  - 1 x daily - 7 x weekly - 2 sets - 3 reps - 30 hold - Clamshell with Resistance  - 1 x daily - 7 x weekly - 2 sets - 10 reps - 5 hold ASSESSMENT:  CLINICAL IMPRESSION:  Continued with current plan of care as laid out in evaluation and recent prior sessions. Pt remains motivated to advance progress toward goals in order to maximize independence and safety at home. Pt requires high level assistance and cuing for completion of exercises in order to provide adequate level of stimulation challenge while minimizing pain and discomfort when possible. Pt closely monitored throughout session pt response and to maximize patient safety during interventions. Pt continues to demonstrate progress toward goals AEB progression of interventions this date either in volume or intensity.     OBJECTIVE IMPAIRMENTS: decreased activity tolerance, decreased mobility, difficulty walking, decreased ROM, hypomobility, impaired sensation, postural dysfunction, and pain.   ACTIVITY LIMITATIONS: bending, bed mobility, locomotion level, and general affect on ADLs with increased pain  PARTICIPATION LIMITATIONS: cleaning, laundry, shopping, community activity, occupation, and yard work  PERSONAL FACTORS: Sex, Time since onset of injury/illness/exacerbation, and 1-2 comorbidities: PMH significant for the following: elevated BP reading, hx of multiple  miscarriages, abscess of R breast, macromastia, symptomatic mammary hypertrophy, back pain, neck pain, hx of appendectomy.  are also affecting patient's functional outcome.   REHAB POTENTIAL: Fair    CLINICAL DECISION MAKING: Evolving/moderate complexity  EVALUATION COMPLEXITY: Moderate   GOALS: Goals reviewed with patient? Yes  SHORT TERM GOALS: Target date:01/27/2023      Patient will be independent in home exercise program to improve strength/mobility for better functional independence with ADLs. Baseline: to be initiated next 1-2 visits 3/7: HEP compliance  Goal status: Partially Met   LONG TERM GOALS: Target date: 02/24/2023     Patient will increase FOTO score to equal to or greater than  71  to demonstrate statistically significant improvement  in mobility and quality of life.  Baseline: 58 3/7: 61% Goal status: Partially Met  2.   Patient will report a worst pain of 3/10 on VAS in  lower back  to improve tolerance with ADLs and reduced symptoms with activities.  Baseline: pt presents with 6/10 pain following taking pain medication 3/7: 4-5/10  Goal status: Partially Met  3.    Patient will reduce modified Oswestry score to <18 as to demonstrate minimal disability with ADLs including improved sleeping tolerance, walking/sitting tolerance etc for better mobility with ADLs.  Baseline: 19% 3/7: 15% Goal status: MET  4.  Patient will reduce Neck Disability Index score to <10% to demonstrate minimal disability with ADL's including improved sleeping tolerance, sitting tolerance, etc for better mobility at home and work. Baseline: 26% 3/7: 22% Goal status: Partially Met  5.  Patient will be independent with a strengthening gym protocol with minimal (<3/10 ) pain increase.  Baseline: new Goal status: NEW   PLAN:  PT FREQUENCY: 2x/week  PT DURATION: 6 weeks  PLANNED INTERVENTIONS: Therapeutic exercises, Therapeutic activity, Neuromuscular re-education, Balance  training, Gait training, Patient/Family education, Self Care, Joint mobilization, Joint manipulation, Stair training, Vestibular training, Canalith repositioning, Orthotic/Fit training, DME instructions, Dry Needling, Electrical stimulation, Spinal mobilization, Cryotherapy, Moist heat, scar mobilization, Splintting, Taping, Traction, and Manual therapy.  PLAN FOR NEXT SESSION: update HEP for strengthening, strengthening, stretching, manual as indicated.    Particia Lather, PT 01/20/2023, 3:22 PM  3:22 PM, 01/20/23

## 2023-01-25 ENCOUNTER — Ambulatory Visit: Payer: 59 | Admitting: Surgical

## 2023-01-25 ENCOUNTER — Ambulatory Visit: Payer: 59 | Admitting: Physical Therapy

## 2023-01-25 DIAGNOSIS — M542 Cervicalgia: Secondary | ICD-10-CM | POA: Diagnosis not present

## 2023-01-25 DIAGNOSIS — M6281 Muscle weakness (generalized): Secondary | ICD-10-CM | POA: Diagnosis not present

## 2023-01-25 DIAGNOSIS — M5459 Other low back pain: Secondary | ICD-10-CM | POA: Diagnosis not present

## 2023-01-25 NOTE — Therapy (Signed)
OUTPATIENT PHYSICAL THERAPY THORACOLUMBAR TREATMENT NOTE   Patient Name: Julie Vaughn MRN: [id number] DOB:1986-03-28, 37 y.o., female Today's Date: 01/25/2023  END OF SESSION:  PT End of Session - 01/25/23 1709     Visit Number 12    Number of Visits 21    Date for PT Re-Evaluation 02/24/23    Authorization Type [employer]-    Authorization Time Period 12/06/22-01/17/23    Progress Note Due on Visit 10    PT Start Time 1605    PT Stop Time 1650    PT Time Calculation (min) 45 min    Activity Tolerance Patient tolerated treatment well    Behavior During Therapy [facility] for tasks assessed/performed                   Past Medical History:  Diagnosis Date   History of multiple miscarriages    Obesity    Past Surgical History:  Procedure Laterality Date   APPENDECTOMY     INTRAUTERINE DEVICE (IUD) INSERTION     Patient Active Problem List   Diagnosis Date Noted   Symptomatic mammary hypertrophy 11/30/2022   Back pain 11/30/2022   Neck pain 11/30/2022   Gas pain 10/15/2022   Loose stools 10/15/2022   Right sided abdominal pain 10/15/2022   Abscess of right breast 10/11/2022   Macromastia 10/11/2022   Acute upper abdominal pain 04/02/2022   Elevated blood pressure reading 03/15/2019   Class 1 obesity due to excess calories with body mass index (BMI) of 30.0 to 30.9 in adult 03/15/2019   IUD (intrauterine device) in place 03/15/2019   History of multiple miscarriages     PCP: Silverio Decamp, MD   REFERRING PROVIDER: Wallace Going, DO   REFERRING DIAG:  N61.1 (ICD-10-CM) - Abscess of right breast  N62 (ICD-10-CM) - Symptomatic mammary hypertrophy  M54.6,G89.29 (ICD-10-CM) - Chronic bilateral thoracic back pain  M54.2 (ICD-10-CM) - Neck pain    Rationale for Evaluation and Treatment: Rehabilitation  THERAPY DIAG:  Other low back pain  Muscle weakness (generalized)  Cervicalgia  ONSET DATE: 17 years ago  SUBJECTIVE:                                                                                                                                                                                            SUBJECTIVE STATEMENT:  Pt coming from work today. Reports that today was a good day, but yesterday was rough. States that she started working out with low intensity cardio program performing yesterday with use of row machine, stair stepper and elliptical. And states that neck is more painful  than low and mid back on this day.    PERTINENT HISTORY:   Pt is a pleasant 37 yo female referred to PT with dx of mammary hypertrophy, neck pain, and back pain. Pt reports pain started 17 years ago. She reports she experiences back pain throughout entire day, affecting her work day and her ability to sleep through the night. She currently manages her pain with ibuprofen. Pt also with hx of recurrent abscesses between her breasts. She has been told not to wear bras with underwire as a result, but now must wear multiple sports brass to improve support/comfort. Her low back pain is currently 6/10. Pt also with chronic neck pain with occ R UE numbness. She has experienced RUE weakness in the past associated with her pain. She reports no LE weakness or issues with balance. Pt was attending crossfit at one time and reports this reduced her pain some but did not resolve it. Pt it trialing PT prior to breast reduction surgery. 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 Per chart, PMH significant for the following: elevated BP reading, hx of multiple miscarriages, abscess of R breast, macromastia, symptomatic mammary hypertrophy, back pain, neck pain, hx of appendectomy.  PAIN:  Are you having pain? Yes: NPRS scale:  low back 0/10, mid back 3/10 and  neck shoulders 4/10 Pain location: neck, low back and upper back Pain description: aching, sharp "it wakes me up" Aggravating factors: activity Relieving factors:  ibuprofen  PRECAUTIONS: Pt with hx of abscesses between breasts.   WEIGHT BEARING RESTRICTIONS: No  FALLS:  Has patient fallen in last 6 months? 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  NEXT MD VISIT: Pt has a follow-up appointment with her PA on 3/22  OBJECTIVE:    TODAY'S TREATMENT:                                                                                                                              DATE: 01/25/23  Row machine in [place] performed 30 sec x 2 with instruction for improved technique to engage mid and low traps with pull towards diaphragm rather than towards clavicle to reduce engagement of upper traps.   On Plinth:  Supine: LE rotation 2 x 12 Bil  Open Book stretch 2x10 reps ea side  Hip abduction with GTB x 10  Bridges Adduction ball squeeze with posterior pelvic tilt 15 x 5 sec holds  Qped over [place] Shoulder flexion 2 x 8 bil with cues for engaged of lower parascapular muscles. Donkey kicks 2x8 bil.  PT instructed pt in seated neck stretch with HEP provided for suboccipitals, upper traps, and splenis capitus. See below for details.   Throughout therex, cues for improved  TA activation with breahting, and reduced compensation through upper traps for all movement in qped. MHP applied to low back for pain modulation throughout session.    PATIENT EDUCATION:  Education details: further assessment findings, exercise technique, HEP Person educated: Patient Education method: Explanation, Demonstration, Tactile cues, Verbal cues, and Handouts Education comprehension: verbalized understanding and returned demonstration  HOME EXERCISE PROGRAM: Access Code: [email or web address] URL: [email or web address] Date:  12/29/2022 (updated)  Prepared by: Rivka Barbara  Exercises - Seated Shoulder Flexion Towel Slide at Table Top  - 1 x daily - 7 x weekly - 2 sets - 10 reps - Supine Figure 4 Piriformis Stretch  - 1 x daily - 7 x weekly - 2 sets - 2 reps - 30 seconds hold - Supine Lower Trunk Rotation  - 1 x daily - 7 x weekly - 10 reps - Clamshell with Resistance  - 1 x daily - 7 x weekly - 2 sets - 12 reps - Bridge with Hip Abduction and Resistance  - 1 x daily - 7 x weekly - 2 sets - 12 reps - Cat Cow  - 1 x daily - 7 x weekly - 1 sets - 10 reps - 5 sec  hold   Access Code: [email or web address] URL: [email or web address] Date: 01/06/2023 Prepared by: [place] with [place]  - 1 x daily - 7 x weekly - 2 sets - 10 reps - 5 hold - Dead Bug with Swiss Ball  - 1 x daily - 7 x weekly - 2 sets - 10 reps - 5 hold - Seated Abdominal Press into [employer]  - 1 x daily - 7 x weekly - 2 sets - 10 reps - 5 hold - Supine Hamstring Swiss Ball Curls/Dynamic Mobilization  - 1 x daily - 7 x weekly - 2 sets - 10 reps - 5 hold - Supine Lower Trunk Rotation  - 1 x daily - 7 x weekly - 2 sets - 10 reps - 5 hold - Supine Bridge  - 1 x daily - 7 x weekly - 2 sets - 10 reps - 5 hold - Supine 90/90 Abdominal Bracing  - 1 x daily - 7 x weekly - 2 sets - 3 reps - 30 hold - Clamshell with Resistance  - 1 x daily - 7 x weekly - 2 sets - 10 reps - 5 hold  Access Code: [email or web address] URL: [email or web address] Date: 01/25/2023 Prepared by: Barrie Folk  Exercises - Seated Cervical Retraction  - 1 x daily - 7 x weekly - 3 sets - 10 reps - 3 hold - Seated Upper Trapezius Stretch  - 1 x daily - 7 x weekly - 2 sets - 3 reps - 30 hold - Seated Levator Scapulae Stretch  - 1 x daily - 7 x weekly - 3 sets - 10 reps - 30 hold ASSESSMENT:  CLINICAL IMPRESSION:  Continued with current plan of care as laid out in evaluation and recent prior sessions. Pt remains motivated to advance progress  toward goals in order to maximize independence and safety at home. Pt reports increased cervical pain on this day. Responded well to exercise modification to reduce neck pain and improve activation of mid/lower traps with use of row machine at home gym. Pt requires high level assistance and cuing for completion of exercises in order to provide adequate level of stimulation challenge while minimizing pain and discomfort when possible. Pt closely monitored throughout session pt response and to maximize patient  safety during interventions. Pt continues to demonstrate progress toward goals AEB progression of interventions this date either in volume or intensity.     OBJECTIVE IMPAIRMENTS: decreased activity tolerance, decreased mobility, difficulty walking, decreased ROM, hypomobility, impaired sensation, postural dysfunction, and pain.   ACTIVITY LIMITATIONS: bending, bed mobility, locomotion level, and general affect on ADLs with increased pain  PARTICIPATION LIMITATIONS: cleaning, laundry, shopping, community activity, occupation, and yard work  PERSONAL FACTORS: Sex, Time since onset of injury/illness/exacerbation, and 1-2 comorbidities: PMH significant for the following: elevated BP reading, hx of multiple miscarriages, abscess of R breast, macromastia, symptomatic mammary hypertrophy, back pain, neck pain, hx of appendectomy.  are also affecting patient's functional outcome.   REHAB POTENTIAL: Fair    CLINICAL DECISION MAKING: Evolving/moderate complexity  EVALUATION COMPLEXITY: Moderate   GOALS: Goals reviewed with patient? Yes  SHORT TERM GOALS: Target date:01/27/2023      Patient will be independent in home exercise program to improve strength/mobility for better functional independence with ADLs. Baseline: to be initiated next 1-2 visits 3/7: HEP compliance  Goal status: Partially Met   LONG TERM GOALS: Target date: 02/24/2023     Patient will increase FOTO score to equal  to or greater than  71  to demonstrate statistically significant improvement in mobility and quality of life.  Baseline: 58 3/7: 61% Goal status: Partially Met  2.   Patient will report a worst pain of 3/10 on VAS in  lower back  to improve tolerance with ADLs and reduced symptoms with activities.  Baseline: pt presents with 6/10 pain following taking pain medication 3/7: 4-5/10  Goal status: Partially Met  3.    Patient will reduce modified Oswestry score to <18 as to demonstrate minimal disability with ADLs including improved sleeping tolerance, walking/sitting tolerance etc for better mobility with ADLs.  Baseline: 19% 3/7: 15% Goal status: MET  4.  Patient will reduce Neck Disability Index score to <10% to demonstrate minimal disability with ADL's including improved sleeping tolerance, sitting tolerance, etc for better mobility at home and work. Baseline: 26% 3/7: 22% Goal status: Partially Met  5.  Patient will be independent with a strengthening gym protocol with minimal (<3/10 ) pain increase.  Baseline: new Goal status: NEW   PLAN:  PT FREQUENCY: 2x/week  PT DURATION: 6 weeks  PLANNED INTERVENTIONS: Therapeutic exercises, Therapeutic activity, Neuromuscular re-education, Balance training, Gait training, Patient/Family education, Self Care, Joint mobilization, Joint manipulation, Stair training, Vestibular training, Canalith repositioning, Orthotic/Fit training, DME instructions, Dry Needling, Electrical stimulation, Spinal mobilization, Cryotherapy, Moist heat, scar mobilization, Splintting, Taping, Traction, and Manual therapy.  PLAN FOR NEXT SESSION: strengthening, stretching, manual as indicated for low/mid/upper back pain .    Barrie Folk PT, DPT  Physical Therapist - [facility]  5:18 PM 01/25/23

## 2023-01-26 NOTE — Therapy (Signed)
OUTPATIENT PHYSICAL THERAPY THORACOLUMBAR TREATMENT NOTE   Patient Name: Julie Vaughn MRN: [id number] DOB:11-24-1985, 37 y.o., female Today's Date: 01/27/2023  END OF SESSION:  PT End of Session - 01/27/23 1544     Visit Number 13    Number of Visits 21    Date for PT Re-Evaluation 02/24/23    Authorization Type [employer]-    Authorization Time Period 12/06/22-01/17/23    Progress Note Due on Visit 10    PT Start Time 1543    PT Stop Time 1627    PT Time Calculation (min) 44 min    Activity Tolerance Patient tolerated treatment well    Behavior During Therapy [facility] for tasks assessed/performed                   Past Medical History:  Diagnosis Date   History of multiple miscarriages    Obesity    Past Surgical History:  Procedure Laterality Date   APPENDECTOMY     INTRAUTERINE DEVICE (IUD) INSERTION     Patient Active Problem List   Diagnosis Date Noted   Symptomatic mammary hypertrophy 11/30/2022   Back pain 11/30/2022   Neck pain 11/30/2022   Gas pain 10/15/2022   Loose stools 10/15/2022   Right sided abdominal pain 10/15/2022   Abscess of right breast 10/11/2022   Macromastia 10/11/2022   Acute upper abdominal pain 04/02/2022   Elevated blood pressure reading 03/15/2019   Class 1 obesity due to excess calories with body mass index (BMI) of 30.0 to 30.9 in adult 03/15/2019   IUD (intrauterine device) in place 03/15/2019   History of multiple miscarriages     PCP: Silverio Decamp, MD   REFERRING PROVIDER: Wallace Going, DO   REFERRING DIAG:  N61.1 (ICD-10-CM) - Abscess of right breast  N62 (ICD-10-CM) - Symptomatic mammary hypertrophy  M54.6,G89.29 (ICD-10-CM) - Chronic bilateral thoracic back pain  M54.2 (ICD-10-CM) - Neck pain    Rationale for Evaluation and Treatment: Rehabilitation  THERAPY DIAG:  Other low back pain  Muscle weakness (generalized)  Cervicalgia  ONSET DATE: 17 years ago  SUBJECTIVE:                                                                                                                                                                                            SUBJECTIVE STATEMENT:  Patient reports she goes to the doctor tomorrow. Has lost 10 lb already.   PERTINENT HISTORY:   Pt is a pleasant 37 yo female referred to PT with dx of mammary hypertrophy, neck pain, and back pain. Pt reports pain started 17 years  ago. She reports she experiences back pain throughout entire day, affecting her work day and her ability to sleep through the night. She currently manages her pain with ibuprofen. Pt also with hx of recurrent abscesses between her breasts. She has been told not to wear bras with underwire as a result, but now must wear multiple sports brass to improve support/comfort. Her low back pain is currently 6/10. Pt also with chronic neck pain with occ R UE numbness. She has experienced RUE weakness in the past associated with her pain. She reports no LE weakness or issues with balance. Pt was attending crossfit at one time and reports this reduced her pain some but did not resolve it. Pt it trialing PT prior to breast reduction surgery. 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 Per chart, PMH significant for the following: elevated BP reading, hx of multiple miscarriages, abscess of R breast, macromastia, symptomatic mammary hypertrophy, back pain, neck pain, hx of appendectomy.  PAIN:  Are you having pain? Yes: NPRS scale: low back 3/10, mid back 5/10 and shoulders 2/10 Pain location: neck, low back and upper back Pain description: aching, sharp "it wakes me up" Aggravating factors: activity Relieving factors: ibuprofen  PRECAUTIONS: Pt with hx of abscesses between breasts.   WEIGHT BEARING RESTRICTIONS: No  FALLS:  Has patient fallen in last 6 months? 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  NEXT MD VISIT: Pt has a follow-up appointment with her PA in March.  OBJECTIVE:    TODAY'S TREATMENT:                                                                                                                              DATE: 01/27/23    On Plinth: Supine: Chin tucks 10x 3 seconds  Scapular retraction with cervical rotation 10x each side hold 3 seconds LE rotation 60 seconds Open Book stretch x 10 reps ea side  RTB overhead Y 20x  Posterior pelvic tilt 10x Posterior tilt with TrA activation with march 10x each LE, hold 5 seconds each LE  Adduction ball squeeze with posterior pelvic tilt 15 x 5 sec holds  GTB abduction with bridge for increased gluteal activation 2 x 10 reps  Reverse froggers 10x each side with cue for core activation. Modified child pose on forearms 4x30 seconds  PATIENT EDUCATION:  Education details: further assessment findings, exercise technique, HEP Person educated: Patient Education method: Explanation, Demonstration, Tactile cues, Verbal cues, and Handouts Education comprehension: verbalized understanding and returned demonstration  HOME EXERCISE PROGRAM: Access Code: [email or web address] URL: [email or web address] Date: 12/29/2022 (updated)  Prepared by: Rivka Barbara  Exercises - Seated Shoulder Flexion Towel Slide at Table Top  - 1 x daily - 7  x weekly - 2 sets - 10 reps - Supine Figure 4 Piriformis Stretch  - 1 x daily - 7 x weekly - 2 sets - 2 reps - 30 seconds hold - Supine Lower Trunk Rotation  - 1 x daily - 7 x weekly - 10 reps - Clamshell with Resistance  - 1 x daily - 7 x weekly - 2 sets - 12 reps - Bridge with Hip Abduction and Resistance  - 1 x daily - 7 x weekly - 2 sets - 12 reps - Cat Cow  - 1 x daily - 7 x weekly - 1 sets - 10 reps - 5 sec   hold   Access Code: [email or web address] URL: [email or web address] Date: 01/06/2023 Prepared by: [place] with [place]  - 1 x daily - 7 x weekly - 2 sets - 10 reps - 5 hold - Dead Bug with Swiss Ball  - 1 x daily - 7 x weekly - 2 sets - 10 reps - 5 hold - Seated Abdominal Press into [employer]  - 1 x daily - 7 x weekly - 2 sets - 10 reps - 5 hold - Supine Hamstring Swiss Ball Curls/Dynamic Mobilization  - 1 x daily - 7 x weekly - 2 sets - 10 reps - 5 hold - Supine Lower Trunk Rotation  - 1 x daily - 7 x weekly - 2 sets - 10 reps - 5 hold - Supine Bridge  - 1 x daily - 7 x weekly - 2 sets - 10 reps - 5 hold - Supine 90/90 Abdominal Bracing  - 1 x daily - 7 x weekly - 2 sets - 3 reps - 30 hold - Clamshell with Resistance  - 1 x daily - 7 x weekly - 2 sets - 10 reps - 5 hold ASSESSMENT:  CLINICAL IMPRESSION:  Patient is eager to progress functional posture and strength. Educated on gym reps and sets for HEP. Continued with current plan of care as laid out in evaluation and recent prior sessions. Pt remains motivated to advance progress toward goals in order to maximize independence and safety at home. Patient's cervical spine pain addressed with postural and cervical strengthening interventions.  Pt closely monitored throughout session pt response and to maximize patient safety during interventions. Pt continues to demonstrate progress toward goals AEB progression of interventions this date either in volume or intensity.     OBJECTIVE IMPAIRMENTS: decreased activity tolerance, decreased mobility, difficulty walking, decreased ROM, hypomobility, impaired sensation, postural dysfunction, and pain.   ACTIVITY LIMITATIONS: bending, bed mobility, locomotion level, and general affect on ADLs with increased pain  PARTICIPATION LIMITATIONS: cleaning, laundry, shopping, community activity, occupation, and yard work  PERSONAL FACTORS: Sex, Time since onset of  injury/illness/exacerbation, and 1-2 comorbidities: PMH significant for the following: elevated BP reading, hx of multiple miscarriages, abscess of R breast, macromastia, symptomatic mammary hypertrophy, back pain, neck pain, hx of appendectomy.  are also affecting patient's functional outcome.   REHAB POTENTIAL: Fair    CLINICAL DECISION MAKING: Evolving/moderate complexity  EVALUATION COMPLEXITY: Moderate   GOALS: Goals reviewed with patient? Yes  SHORT TERM GOALS: Target date:01/27/2023      Patient will be independent in home exercise program to improve strength/mobility for better functional independence with ADLs. Baseline: to be initiated next 1-2 visits 3/7: HEP compliance  Goal status: Partially Met   LONG TERM GOALS: Target date: 02/24/2023     Patient will increase FOTO score to equal  to or greater than  71  to demonstrate statistically significant improvement in mobility and quality of life.  Baseline: 58 3/7: 61% Goal status: Partially Met  2.   Patient will report a worst pain of 3/10 on VAS in  lower back  to improve tolerance with ADLs and reduced symptoms with activities.  Baseline: pt presents with 6/10 pain following taking pain medication 3/7: 4-5/10  Goal status: Partially Met  3.    Patient will reduce modified Oswestry score to <18 as to demonstrate minimal disability with ADLs including improved sleeping tolerance, walking/sitting tolerance etc for better mobility with ADLs.  Baseline: 19% 3/7: 15% Goal status: MET  4.  Patient will reduce Neck Disability Index score to <10% to demonstrate minimal disability with ADL's including improved sleeping tolerance, sitting tolerance, etc for better mobility at home and work. Baseline: 26% 3/7: 22% Goal status: Partially Met  5.  Patient will be independent with a strengthening gym protocol with minimal (<3/10 ) pain increase.  Baseline: new Goal status: NEW   PLAN:  PT FREQUENCY: 2x/week  PT  DURATION: 6 weeks  PLANNED INTERVENTIONS: Therapeutic exercises, Therapeutic activity, Neuromuscular re-education, Balance training, Gait training, Patient/Family education, Self Care, Joint mobilization, Joint manipulation, Stair training, Vestibular training, Canalith repositioning, Orthotic/Fit training, DME instructions, Dry Needling, Electrical stimulation, Spinal mobilization, Cryotherapy, Moist heat, scar mobilization, Splintting, Taping, Traction, and Manual therapy.  PLAN FOR NEXT SESSION: update HEP for strengthening, strengthening, stretching, manual as indicated.    Janna Arch, PT 01/27/2023, 4:27 PM  4:27 PM, 01/27/23

## 2023-01-27 ENCOUNTER — Ambulatory Visit: Payer: 59

## 2023-01-27 DIAGNOSIS — M5459 Other low back pain: Secondary | ICD-10-CM | POA: Diagnosis not present

## 2023-01-27 DIAGNOSIS — M6281 Muscle weakness (generalized): Secondary | ICD-10-CM | POA: Diagnosis not present

## 2023-01-27 DIAGNOSIS — M542 Cervicalgia: Secondary | ICD-10-CM

## 2023-01-27 NOTE — Progress Notes (Signed)
   Referring Provider Silverio Decamp, MD [place] [address] [place],  [place] [phone]   CC: No chief complaint on file.     Julie Vaughn is an 37 y.o. female.  HPI: Patient is a 37 y.o. year old female here for follow up after completing physical therapy for pain related to macromastia. Patient had a consultation with Dr. Marla Roe related to mammary hypertrophy on 11/30/2022.  She was referred to physical therapy at that time.  She has completed a total of 12 visits of physical therapy starting December 06, 2022 and with her most recent appointment on 01/25/2023.  Patient reports that she is still having ongoing mid neck, mid back and low back pain.  She reports that physical therapy was helpful with learning stretches and posture, however she is still having pain which is daily and very bothersome to her.  She reports she is still interested in pursuing surgical intervention.  She does report that she is going to a weight loss clinic with Dr. Charise Carwin at [facility] in [place].  She reports that this has been somewhat helpful, she is improving her diet and taking some supplements and exercising.  At her last appointment her weight was 173 pounds, since then she has lost 5 pounds and is now 168 pounds.  She does report that her scale at home records her weight is 166 pounds.  Of note she tells me she has a history of breast abscesses.  Review of Systems MSK: Endorses neck and back pain  Physical Exam    11/30/2022    9:16 AM 11/04/2022    8:44 AM 10/21/2022    1:17 PM  Vitals with BMI  Height 5\' 6"     Weight 173 lbs 171 lbs 171 lbs  BMI 0000000  0000000  Systolic [id number]  Diastolic 84 78 76  Pulse 74 81 77    General:  No acute distress,  Alert and oriented, Non-Toxic, Normal speech and affect Psych: Normal behavior and mood    Assessment/Plan Patient is interested in pursuing surgical intervention for bilateral breast reduction. Patient has  completed at least 6 weeks of physical therapy for pain related to macromastia.  Discussed with patient we would submit to insurance for authorization, discussed approval could take up to 6 weeks.   We will have her sign a release of information for the weight loss and wellness clinic in [place] 01/27/2023, 3:17 PM

## 2023-01-28 ENCOUNTER — Ambulatory Visit: Payer: 59 | Admitting: Surgical

## 2023-01-28 VITALS — Wt 168.0 lb

## 2023-01-28 DIAGNOSIS — M546 Pain in thoracic spine: Secondary | ICD-10-CM | POA: Diagnosis not present

## 2023-01-28 DIAGNOSIS — M542 Cervicalgia: Secondary | ICD-10-CM

## 2023-01-28 DIAGNOSIS — G8929 Other chronic pain: Secondary | ICD-10-CM

## 2023-01-28 DIAGNOSIS — N62 Hypertrophy of breast: Secondary | ICD-10-CM

## 2023-01-28 DIAGNOSIS — N611 Abscess of the breast and nipple: Secondary | ICD-10-CM

## 2023-03-03 ENCOUNTER — Telehealth: Payer: Self-pay | Admitting: Plastic Surgery

## 2023-03-03 NOTE — Telephone Encounter (Signed)
Pending Ref# [id number] for BIL breast reduction

## 2023-03-17 ENCOUNTER — Telehealth: Payer: Self-pay | Admitting: Plastic Surgery

## 2023-03-17 NOTE — Telephone Encounter (Signed)
Notified pt of insurance denial for BIL Breast reduction.  Reasoning from insurance for denial is not enough breast tissue to be taken off.  Insurance stated 795 gr per breast and provider is recommending 485 grams per breast.  Pt would like to see if provider can take more off.

## 2023-03-31 ENCOUNTER — Telehealth: Payer: Self-pay | Admitting: Plastic Surgery

## 2023-03-31 NOTE — Telephone Encounter (Signed)
Pt called asking about a peer to peer that Dr Ulice Bold would be doing, she stated a couple weeks ago she was denied by insurance

## 2023-05-20 ENCOUNTER — Other Ambulatory Visit: Payer: Self-pay | Admitting: Oncology

## 2023-05-20 DIAGNOSIS — Z006 Encounter for examination for normal comparison and control in clinical research program: Secondary | ICD-10-CM

## 2023-06-02 ENCOUNTER — Ambulatory Visit: Payer: 59 | Admitting: Medical-Surgical

## 2023-06-03 ENCOUNTER — Ambulatory Visit (INDEPENDENT_AMBULATORY_CARE_PROVIDER_SITE_OTHER): Payer: 59 | Admitting: Sports Medicine

## 2023-06-03 VITALS — BP 163/93 | HR 88 | Ht 66.0 in | Wt 171.0 lb

## 2023-06-03 DIAGNOSIS — F4323 Adjustment disorder with mixed anxiety and depressed mood: Secondary | ICD-10-CM | POA: Diagnosis not present

## 2023-06-03 DIAGNOSIS — Z0000 Encounter for general adult medical examination without abnormal findings: Secondary | ICD-10-CM

## 2023-06-03 DIAGNOSIS — E6609 Other obesity due to excess calories: Secondary | ICD-10-CM

## 2023-06-03 DIAGNOSIS — Z6830 Body mass index (BMI) 30.0-30.9, adult: Secondary | ICD-10-CM

## 2023-06-03 DIAGNOSIS — F4320 Adjustment disorder, unspecified: Secondary | ICD-10-CM | POA: Insufficient documentation

## 2023-06-03 MED ORDER — ALPRAZOLAM 0.5 MG PO TABS: 0.5000 mg | ORAL_TABLET | Freq: Two times a day (BID) | ORAL | 0 refills | Status: AC | PRN

## 2023-06-03 MED ORDER — PHENTERMINE HCL 37.5 MG PO TABS: ORAL_TABLET | ORAL | 0 refills | Status: DC

## 2023-06-03 NOTE — Assessment & Plan Note (Signed)
Father-in-law is in end stages of life. Having some disagreements with family. Adding short course of alprazolam for now.

## 2023-06-03 NOTE — Progress Notes (Signed)
Subjective:    CC: Annual Physical Exam  HPI:  This patient is here for their annual physical  I reviewed the past medical history, family history, social history, surgical history, and allergies today and no changes were needed.  Please see the problem list section below in epic for further details.  Past Medical History: Past Medical History:  Diagnosis Date   History of multiple miscarriages    Obesity    Past Surgical History: Past Surgical History:  Procedure Laterality Date   APPENDECTOMY     INTRAUTERINE DEVICE (IUD) INSERTION     Social History: Social History   Socioeconomic History   Marital status: Married    Spouse name: Not on file   Number of children: Not on file   Years of education: Not on file   Highest education level: Not on file  Occupational History   Not on file  Tobacco Use   Smoking status: Never   Smokeless tobacco: Never  Vaping Use   Vaping status: Never Used  Substance and Sexual Activity   Alcohol use: Yes   Drug use: Never   Sexual activity: Yes    Birth control/protection: I.U.D.  Other Topics Concern   Not on file  Social History Narrative   Not on file   Social Determinants of Health   Financial Resource Strain: Not on file  Food Insecurity: Not on file  Transportation Needs: Not on file  Physical Activity: Not on file  Stress: Not on file  Social Connections: Not on file   Family History: Family History  Problem Relation Age of Onset   Hypertension Mother    Heart attack Maternal Aunt    Heart attack Maternal Aunt    Cancer Neg Hx    Allergies: Allergies  Allergen Reactions   Hydrocodone Itching   Medications: See med rec.  Review of Systems: No headache, visual changes, nausea, vomiting, diarrhea, constipation, dizziness, abdominal pain, skin rash, fevers, chills, night sweats, swollen lymph nodes, weight loss, chest pain, body aches, joint swelling, muscle aches, shortness of breath, mood changes, visual or  auditory hallucinations.  Objective:    General: Well Developed, well nourished, and in no acute distress.  Neuro: Alert and oriented x3, extra-ocular muscles intact, sensation grossly intact. Cranial nerves II through XII are intact, motor, sensory, and coordinative functions are all intact. HEENT: Normocephalic, atraumatic, pupils equal round reactive to light, neck supple, no masses, no lymphadenopathy, thyroid nonpalpable. Oropharynx, nasopharynx, external ear canals are unremarkable. Skin: Warm and dry, no rashes noted.  Cardiac: Regular rate and rhythm, no murmurs rubs or gallops.  Respiratory: Clear to auscultation bilaterally. Not using accessory muscles, speaking in full sentences.  Abdominal: Soft, nontender, nondistended, positive bowel sounds, no masses, no organomegaly.  Musculoskeletal: Shoulder, elbow, wrist, hip, knee, ankle stable, and with full range of motion.  Impression and Recommendations:    The patient was counselled, risk factors were discussed, anticipatory guidance given.  Annual physical exam CPE as above. Up-to-date on cervical cancer screening, she will be due next year for a Pap smear.   Class 1 obesity due to excess calories with body mass index (BMI) of 30.0 to 30.9 in adult Good weight loss so far on her own, we will add phentermine, GLP-1's were too expensive. Return monthly for weight checks and refills.  Adjustment disorder Father-in-law is in end stages of life. Having some disagreements with family. Adding short course of alprazolam for now.   ____________________________________________ Ihor Austin. Benjamin Stain, M.D., ABFM., CAQSM.,  AME. Primary Care and Sports Medicine [facility]  Adjunct Professor of Family Medicine  [employer]  Restaurant manager, fast food

## 2023-06-03 NOTE — Assessment & Plan Note (Signed)
CPE as above. Up-to-date on cervical cancer screening, she will be due next year for a Pap smear.

## 2023-06-03 NOTE — Assessment & Plan Note (Signed)
Good weight loss so far on her own, we will add phentermine, GLP-1's were too expensive. Return monthly for weight checks and refills.

## 2023-06-04 LAB — COMPREHENSIVE METABOLIC PANEL WITH GFR
ALT: 31 IU/L (ref 0–32)
AST: 28 IU/L (ref 0–40)
Albumin: 4.7 g/dL (ref 3.9–4.9)
Alkaline Phosphatase: 61 IU/L (ref 44–121)
BUN/Creatinine Ratio: 22 (ref 9–23)
BUN: 17 mg/dL (ref 6–20)
Bilirubin Total: 0.5 mg/dL (ref 0.0–1.2)
CO2: 23 mmol/L (ref 20–29)
Calcium: 9.6 mg/dL (ref 8.7–10.2)
Chloride: 102 mmol/L (ref 96–106)
Creatinine, Ser: 0.76 mg/dL (ref 0.57–1.00)
Globulin, Total: 2.8 g/dL (ref 1.5–4.5)
Glucose: 85 mg/dL (ref 70–99)
Potassium: 4.2 mmol/L (ref 3.5–5.2)
Sodium: 140 mmol/L (ref 134–144)
Total Protein: 7.5 g/dL (ref 6.0–8.5)
eGFR: 102 mL/min/1.73 (ref >59)

## 2023-06-04 LAB — LIPID PANEL
Chol/HDL Ratio: 2.6 ratio (ref 0.0–4.4)
Cholesterol, Total: 227 mg/dL — ABNORMAL HIGH (ref 100–199)
HDL: 89 mg/dL (ref >39)
LDL Chol Calc (NIH): 127 mg/dL — ABNORMAL HIGH (ref 0–99)
Triglycerides: 64 mg/dL (ref 0–149)
VLDL Cholesterol Cal: 11 mg/dL (ref 5–40)

## 2023-06-04 LAB — CBC
Hematocrit: 41 % (ref 34.0–46.6)
Hemoglobin: 13.9 g/dL (ref 11.1–15.9)
MCH: 28.7 pg (ref 26.6–33.0)
MCHC: 33.9 g/dL (ref 31.5–35.7)
MCV: 85 fL (ref 79–97)
Platelets: 311 x10E3/uL (ref 150–450)
RBC: 4.85 x10E6/uL (ref 3.77–5.28)
RDW: 12.4 % (ref 11.7–15.4)
WBC: 7 x10E3/uL (ref 3.4–10.8)

## 2023-06-04 LAB — HEMOGLOBIN A1C
Est. average glucose Bld gHb Est-mCnc: 114 mg/dL
Hgb A1c MFr Bld: 5.6 % (ref 4.8–5.6)

## 2023-06-04 LAB — TSH: TSH: 0.791 u[IU]/mL (ref 0.450–4.500)

## 2023-07-04 ENCOUNTER — Ambulatory Visit: Payer: 59 | Admitting: Sports Medicine

## 2023-07-14 ENCOUNTER — Encounter: Payer: Self-pay | Admitting: Sports Medicine

## 2023-07-14 ENCOUNTER — Ambulatory Visit (INDEPENDENT_AMBULATORY_CARE_PROVIDER_SITE_OTHER): Payer: 59 | Admitting: Sports Medicine

## 2023-07-14 VITALS — BP 131/77 | HR 76 | Ht 66.0 in | Wt 169.0 lb

## 2023-07-14 DIAGNOSIS — Z6830 Body mass index (BMI) 30.0-30.9, adult: Secondary | ICD-10-CM | POA: Diagnosis not present

## 2023-07-14 DIAGNOSIS — E6609 Other obesity due to excess calories: Secondary | ICD-10-CM

## 2023-07-14 MED ORDER — PHENTERMINE HCL 37.5 MG PO TABS: ORAL_TABLET | ORAL | 0 refills | Status: DC

## 2023-07-14 MED ORDER — METFORMIN HCL ER 500 MG PO TB24: 500.0000 mg | ORAL_TABLET | Freq: Every day | ORAL | 3 refills | Status: AC

## 2023-07-14 NOTE — Assessment & Plan Note (Signed)
We added phentermine approximately a month ago, she is down a few pounds, refilling phentermine, adding metformin for potential insulin resistance. We did discuss compounded GLP-1's, they were too expensive. We can also certainly try topiramate in the future should she plateau. We have discussed a goal of 5 pounds over the next month.

## 2023-07-14 NOTE — Progress Notes (Signed)
    Procedures performed today:    None.  Independent interpretation of notes and tests performed by another provider:   None.  Brief History, Exam, Impression, and Recommendations:    Class 1 obesity due to excess calories with body mass index (BMI) of 30.0 to 30.9 in adult We added phentermine approximately a month ago, she is down a few pounds, refilling phentermine, adding metformin for potential insulin resistance. We did discuss compounded GLP-1's, they were too expensive. We can also certainly try topiramate in the future should she plateau. We have discussed a goal of 5 pounds over the next month.    ____________________________________________ Ihor Austin. Benjamin Stain, M.D., ABFM., CAQSM., AME. Primary Care and Sports Medicine [facility]  Adjunct Professor of Family Medicine  [place] [employer] [facility] [employer]  Restaurant manager, fast food

## 2023-08-11 ENCOUNTER — Encounter: Payer: Self-pay | Admitting: Sports Medicine

## 2023-08-11 ENCOUNTER — Ambulatory Visit (INDEPENDENT_AMBULATORY_CARE_PROVIDER_SITE_OTHER): Payer: 59 | Admitting: Sports Medicine

## 2023-08-11 DIAGNOSIS — E6609 Other obesity due to excess calories: Secondary | ICD-10-CM | POA: Diagnosis not present

## 2023-08-11 DIAGNOSIS — E66811 Obesity, class 1: Secondary | ICD-10-CM | POA: Diagnosis not present

## 2023-08-11 DIAGNOSIS — Z6830 Body mass index (BMI) 30.0-30.9, adult: Secondary | ICD-10-CM

## 2023-08-11 MED ORDER — PHENTERMINE HCL 37.5 MG PO TABS: ORAL_TABLET | ORAL | 0 refills | Status: DC

## 2023-08-11 NOTE — Progress Notes (Signed)
    Procedures performed today:    None.  Independent interpretation of notes and tests performed by another provider:   None.  Brief History, Exam, Impression, and Recommendations:    Class 1 obesity due to excess calories with body mass index (BMI) of 30.0 to 30.9 in adult Additional 5 pounds after the second month of phentermine. GLP-1's were too expensive. If she plateaus we will add topiramate. Goal weight is in the 140s. Return in a month.    ____________________________________________ Ihor Austin. Benjamin Stain, M.D., ABFM., CAQSM., AME. Primary Care and Sports Medicine [facility]  Adjunct Professor of Family Medicine  [place] [employer] [facility] [employer]  Restaurant manager, fast food

## 2023-08-11 NOTE — Assessment & Plan Note (Signed)
Additional 5 pounds after the second month of phentermine. GLP-1's were too expensive. If she plateaus we will add topiramate. Goal weight is in the 140s. Return in a month.

## 2023-08-14 ENCOUNTER — Other Ambulatory Visit: Payer: Self-pay | Admitting: Sports Medicine

## 2023-08-14 DIAGNOSIS — E66811 Obesity, class 1: Secondary | ICD-10-CM

## 2023-09-12 ENCOUNTER — Ambulatory Visit (INDEPENDENT_AMBULATORY_CARE_PROVIDER_SITE_OTHER): Payer: 59 | Admitting: Sports Medicine

## 2023-09-12 ENCOUNTER — Encounter: Payer: Self-pay | Admitting: Sports Medicine

## 2023-09-12 DIAGNOSIS — E6609 Other obesity due to excess calories: Secondary | ICD-10-CM

## 2023-09-12 DIAGNOSIS — Z6830 Body mass index (BMI) 30.0-30.9, adult: Secondary | ICD-10-CM

## 2023-09-12 DIAGNOSIS — E66811 Obesity, class 1: Secondary | ICD-10-CM

## 2023-09-12 MED ORDER — PHENTERMINE HCL 37.5 MG PO TABS: ORAL_TABLET | ORAL | 0 refills | Status: DC

## 2023-09-12 MED ORDER — TOPIRAMATE 50 MG PO TABS: ORAL_TABLET | ORAL | 2 refills | Status: DC

## 2023-09-12 NOTE — Assessment & Plan Note (Signed)
Good initial weight loss on the first couple of months of phentermine, she has plateaued. GLP-1's were too expensive. Adding topiramate and refilling phentermine, entering the fourth month of phentermine.

## 2023-09-12 NOTE — Progress Notes (Signed)
    Procedures performed today:    None.  Independent interpretation of notes and tests performed by another provider:   None.  Brief History, Exam, Impression, and Recommendations:    Class 1 obesity due to excess calories with body mass index (BMI) of 30.0 to 30.9 in adult Good initial weight loss on the first couple of months of phentermine, she has plateaued. GLP-1's were too expensive. Adding topiramate and refilling phentermine, entering the fourth month of phentermine.    ____________________________________________ Ihor Austin. Benjamin Stain, M.D., ABFM., CAQSM., AME. Primary Care and Sports Medicine [facility]  Adjunct Professor of Family Medicine  [place] [employer] [facility] [employer]  Restaurant manager, fast food

## 2023-10-11 ENCOUNTER — Encounter: Payer: Self-pay | Admitting: Sports Medicine

## 2023-10-11 ENCOUNTER — Ambulatory Visit (INDEPENDENT_AMBULATORY_CARE_PROVIDER_SITE_OTHER): Payer: 59 | Admitting: Sports Medicine

## 2023-10-11 DIAGNOSIS — E66811 Obesity, class 1: Secondary | ICD-10-CM | POA: Diagnosis not present

## 2023-10-11 DIAGNOSIS — Z6830 Body mass index (BMI) 30.0-30.9, adult: Secondary | ICD-10-CM

## 2023-10-11 DIAGNOSIS — E6609 Other obesity due to excess calories: Secondary | ICD-10-CM

## 2023-10-11 MED ORDER — TOPIRAMATE 50 MG PO TABS: 50.0000 mg | ORAL_TABLET | Freq: Two times a day (BID) | ORAL | 0 refills | Status: AC

## 2023-10-11 MED ORDER — PHENTERMINE HCL 37.5 MG PO TABS: ORAL_TABLET | ORAL | 0 refills | Status: AC

## 2023-10-11 NOTE — Progress Notes (Signed)
    Procedures performed today:    None.  Independent interpretation of notes and tests performed by another provider:   None.  Brief History, Exam, Impression, and Recommendations:    Class 1 obesity due to excess calories with body mass index (BMI) of 30.0 to 30.9 in adult Pleasant 37 year old female, good initial weight loss first couple months of phentermine, has plateaued to some degree, GLP-1s including compounded were too expensive, she is entering now the fifth month, she is only taken a couple of weeks of the Topamax, she will go ahead and bump this up to twice a day, will like to see her back in a month, we will do 6 months of full dose phentermine and taper down to a half dose daily for 6 more months.  Chronic process not at goal with pharmacologic intervention  ____________________________________________ Ihor Austin. Benjamin Stain, M.D., ABFM., CAQSM., AME. Primary Care and Sports Medicine  [facility]  Adjunct Professor of Family Medicine  [place] [employer] [facility] [employer] [facility] [employer]  Restaurant manager, fast food

## 2023-10-11 NOTE — Assessment & Plan Note (Addendum)
Pleasant 37 year old female, good initial weight loss first couple months of phentermine, has plateaued to some degree, GLP-1s including compounded were too expensive, she is entering now the fifth month, she is only taken a couple of weeks of the Topamax, she will go ahead and bump this up to twice a day, will like to see her back in a month, we will do 6 months of full dose phentermine and taper down to a half dose daily for 6 more months.

## 2023-10-13 ENCOUNTER — Telehealth: Payer: Self-pay

## 2023-10-13 ENCOUNTER — Ambulatory Visit: Payer: Self-pay | Admitting: Sports Medicine

## 2023-10-13 NOTE — Telephone Encounter (Signed)
2nd Attempt at calling the patient back--no answer, left VM, will try back

## 2023-10-13 NOTE — Telephone Encounter (Signed)
Chief Complaint: Medication Question Additional Notes: Spoke with pt, pt reporting taking for weight loss, was doing okay for first 2 weeks and was supposed to titrate up the dose but had not yet increased when saw doc on Tuesday because was "scared of side effects." Pt reporting that she accidentally took 100 mg at night before yesterday and felt severe side effects, such as numbness to the hands, mood swings, dizziness, lightheadedness, brain fog, polydipsia, polyuria. Pt reporting that she does "not want to take this stuff anymore," stating "I don't want this stuff in my body," "these side effects are horrible," and "really only lost 1 lb of weight since been on it." Pt reporting that she did not take any of her medications today, including metformin and others because she did not want side effects, was scared to take anything. Pt reporting that she still feels foggy but was able to work today, but also feels "irritation and depression like mood-swingy-ish." Merged phone call with [facility] Access Line to ensure immediate directions on how to taper med properly.  Reason for Disposition  [1] Caller has URGENT medicine question about med that PCP or specialist prescribed AND [2] triager unable to answer question  Answer Assessment - Initial Assessment Questions 1. NAME of MEDICINE: "What medicine(s) are you calling about?"     Topramax,, taking for weight loss 2. QUESTION: "What is your question?" (e.g., double dose of medicine, side effect)      Pt reporting she had been on regimen with doc to gradually increase the dose, misunderstood directions and accidentally took 100 mg at night day before yesterday instead of 50 mg 3. PRESCRIBER: "Who prescribed the medicine?" Reason: if prescribed by specialist, call should be referred to that group.     Dr. Benjamin Stain 4. SYMPTOMS: "Do you have any symptoms?" If Yes, ask: "What symptoms are you having?"  "How bad are the symptoms (e.g., mild,  moderate, severe)     Took 100 mg, "felt awful," "hands were completely numb, very dizzy, very lightheaded," thought was going to have to go to hospital, didn't want to call 911, "couldn't focus on things, took a long time for things to sink in," urinating a lot throughout the day an "abnormal" amount and "so thirsty." Pt reporting she was just trying to "ride it out," back to work today, feel okay, still a little foggy but could still work, feeling "mood-swingy-ish" with irritation and depression.  Protocols used: Medication Question Call-A-AH

## 2023-10-13 NOTE — Telephone Encounter (Signed)
The double dose of Topamax will be out of her system shortly, the half-life is 21 hours so in a day it will be down to the initial dose she was supposed to be taking.  I think she can restart phentermine and metformin for now.

## 2023-10-13 NOTE — Telephone Encounter (Signed)
Copied from CRM [id number]. Topic: Clinical - Medication Question >> Oct 13, 2023 12:26 PM Alvino Blood C wrote: Reason for CRM: PT believes she took to much topiramate (TOPAMAX) 50 MG tablet and felt out of whack the next day. PT believes she is no longer in need of the medication. She would like a call back at [phone] to discuss if she should continue. PT is working and would  like call back after 3:15pm  1st attempt call to pt: LVM to pt for call back to discuss medication, arranged for call-back by nurses 30 min from now.

## 2023-10-13 NOTE — Telephone Encounter (Signed)
Call transferred from [id number] agent - patient accidentally took 2 tabs of topamax Tuesday night. Shortly after taking the medication, patient felt lightheaded, numbness in her hands, migraine and felt uncoordinated.   Patient missed work on Wednesday because she was still experiencing the symptoms. Patient has decided to stop taking the topamax, phentermine and metformin since her episode.   Now patient is feeling as though she is having withdrawal like symptoms - very irritable and jittery. Requesting feedback from the provider. Please advise, thanks.

## 2023-10-13 NOTE — Telephone Encounter (Signed)
Patient has been updated of provider's feedback. Per patient, she is not going to take the Topamax anymore. She is okay with taking metformin and phentermine.

## 2023-10-17 NOTE — Telephone Encounter (Signed)
Task completed. Concerns were addressed on 10/13/23 per telephone note.

## 2023-11-08 ENCOUNTER — Ambulatory Visit: Payer: 59 | Admitting: Sports Medicine

## 2023-12-01 IMAGING — NM NM HEPATO W/GB/PHARM/[PERSON_NAME]
2 series · 12 of 12 positions shown · non-contrast
Comparison: Ultrasound abdomen 04/08/2022

CLINICAL DATA: Biliary colic, recurrent, question biliary
dyskinesia, RIGHT upper quadrant pain and nausea

EXAM:
NUCLEAR MEDICINE HEPATOBILIARY IMAGING WITH GALLBLADDER EF
TECHNIQUE: Sequential images of the abdomen were obtained [DATE] minutes
following intravenous administration of radiopharmaceutical. After
oral ingestion of Ensure, gallbladder ejection fraction was
determined. At 60 min, normal ejection fraction is greater than 33%.
RADIOPHARMACEUTICALS:  5.07 mCi Kc-22m  Choletec IV

[Series 1000: hepatobiliary scan · 9.59mm/px · 6 of 60 frames shown]
[frame 6/60]
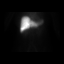
[frame 16/60]
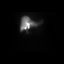
[frame 26/60]
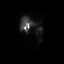
[frame 36/60]
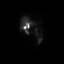
[frame 46/60]
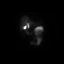
[frame 56/60]
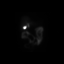

[Series 1000: gallbladder ef · 4.80mm/px · 6 of 120 frames shown]
[frame 11/120]
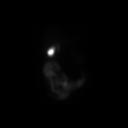
[frame 31/120]
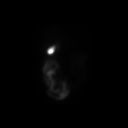
[frame 51/120]
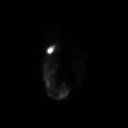
[frame 71/120]
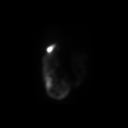
[frame 91/120]
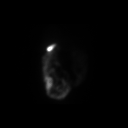
[frame 111/120]
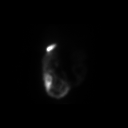

[12 of 12 positions shown; findings below may reference images not displayed]

FINDINGS: Normal tracer extraction from bloodstream indicating normal
hepatocellular function.

Normal excretion of tracer into biliary tree.

Gallbladder visualized at 13 min.

Small bowel visualized at 18 min.

No hepatic retention of tracer.

Subjectively normal emptying of tracer from gallbladder following
fatty meal stimulation.

Calculated gallbladder ejection fraction is 40%, normal.

Patient reported no symptoms following Ensure ingestion.

Normal gallbladder ejection fraction following Ensure ingestion is
greater than 33% at 1 hour.
IMPRESSION: Normal exam.

## 2024-07-10 ENCOUNTER — Encounter: Payer: Self-pay | Admitting: Sports Medicine

## 2024-08-19 ENCOUNTER — Other Ambulatory Visit: Payer: Self-pay | Admitting: Medical Genetics

## 2024-08-19 DIAGNOSIS — Z006 Encounter for examination for normal comparison and control in clinical research program: Secondary | ICD-10-CM
